# Patient Record
Sex: Female | Born: 1945 | Race: Black or African American | Hispanic: No | Smoking: Never smoker
Health system: Southern US, Community
[De-identification: ages and names within clinical notes are randomized; demographics above are authoritative.]

## PROBLEM LIST (undated history)

## (undated) DIAGNOSIS — I10 Essential (primary) hypertension: Secondary | ICD-10-CM

## (undated) DIAGNOSIS — M1990 Unspecified osteoarthritis, unspecified site: Secondary | ICD-10-CM

## (undated) DIAGNOSIS — Z9989 Dependence on other enabling machines and devices: Secondary | ICD-10-CM

## (undated) DIAGNOSIS — L309 Dermatitis, unspecified: Secondary | ICD-10-CM

## (undated) DIAGNOSIS — J302 Other seasonal allergic rhinitis: Secondary | ICD-10-CM

## (undated) HISTORY — PX: COLONOSCOPY: SHX174

## (undated) HISTORY — PX: DILATION AND CURETTAGE OF UTERUS: SHX78

## (undated) HISTORY — PX: OTHER SURGICAL HISTORY: SHX169

## (undated) HISTORY — PX: CHOLECYSTECTOMY: SHX55

## (undated) HISTORY — PX: MULTIPLE TOOTH EXTRACTIONS: SHX2053

---

## 1998-05-30 ENCOUNTER — Emergency Department (HOSPITAL_COMMUNITY): Admission: EM | Admit: 1998-05-30 | Discharge: 1998-05-30 | Payer: Self-pay | Admitting: Emergency Medicine

## 1999-05-20 ENCOUNTER — Emergency Department (HOSPITAL_COMMUNITY): Admission: EM | Admit: 1999-05-20 | Discharge: 1999-05-20 | Payer: Self-pay | Admitting: Emergency Medicine

## 1999-05-20 ENCOUNTER — Encounter: Payer: Self-pay | Admitting: Emergency Medicine

## 2000-03-24 ENCOUNTER — Encounter: Payer: Self-pay | Admitting: Family Medicine

## 2000-03-24 ENCOUNTER — Encounter: Admission: RE | Admit: 2000-03-24 | Discharge: 2000-03-24 | Payer: Self-pay | Admitting: Family Medicine

## 2000-06-28 HISTORY — PX: KNEE ARTHROSCOPY: SHX127

## 2000-07-06 ENCOUNTER — Ambulatory Visit (HOSPITAL_BASED_OUTPATIENT_CLINIC_OR_DEPARTMENT_OTHER): Admission: RE | Admit: 2000-07-06 | Discharge: 2000-07-06 | Payer: Self-pay | Admitting: Orthopedic Surgery

## 2001-11-21 ENCOUNTER — Emergency Department (HOSPITAL_COMMUNITY): Admission: EM | Admit: 2001-11-21 | Discharge: 2001-11-21 | Payer: Self-pay | Admitting: Emergency Medicine

## 2004-12-20 ENCOUNTER — Other Ambulatory Visit: Admission: RE | Admit: 2004-12-20 | Discharge: 2004-12-20 | Payer: Self-pay | Admitting: Obstetrics and Gynecology

## 2005-02-07 ENCOUNTER — Ambulatory Visit (HOSPITAL_COMMUNITY): Admission: RE | Admit: 2005-02-07 | Discharge: 2005-02-07 | Payer: Self-pay | Admitting: Obstetrics and Gynecology

## 2005-02-07 ENCOUNTER — Encounter (INDEPENDENT_AMBULATORY_CARE_PROVIDER_SITE_OTHER): Payer: Self-pay | Admitting: *Deleted

## 2006-01-19 ENCOUNTER — Emergency Department (HOSPITAL_COMMUNITY): Admission: EM | Admit: 2006-01-19 | Discharge: 2006-01-19 | Payer: Self-pay | Admitting: Emergency Medicine

## 2007-07-14 ENCOUNTER — Emergency Department (HOSPITAL_COMMUNITY): Admission: EM | Admit: 2007-07-14 | Discharge: 2007-07-14 | Payer: Self-pay | Admitting: Emergency Medicine

## 2007-07-19 ENCOUNTER — Ambulatory Visit: Payer: Self-pay | Admitting: Cardiovascular Disease

## 2007-07-22 ENCOUNTER — Ambulatory Visit: Payer: Self-pay

## 2011-05-13 NOTE — Assessment & Plan Note (Signed)
[facility]                            CARDIOLOGY OFFICE NOTE   Regina, Oneill                     MRN:          [id number]  DATE:07/19/2007                            DOB:          18-Nov-1946    Regina Oneill is a 65 year old patient referred by [facility], Dr.  Perrin Maltese and Burgess Amor, PA.  She was seen there on Wednesday for chest  pain.  They are seeking our opinion in regards to workup for further  chest pain to rule out coronary artery disease.   The patient has no documented coronary artery disease.  In general, she  does not get chest pain.  She was at work as a Comptroller sitting down  when she had substernal chest pain.  It is atypical for coronary artery  disease.  It was nonexertional.  It was somewhat sharp.  It was in the  center of her chest and radiated to the right shoulder.  She had some  tingling in her right arm.  There was no associated shortness of breath  or diaphoresis.  The episode lasted in the order of 10-20 minutes.  It  was actually gone by the time she went to urgent care.   She did nothing to relieve it and there was nothing that exacerbated it.  In particular, there was no pleuritic pain to it.  She had no recent  trauma.  She has not had previous cardiac workup.   As far she is knows, she is not a diabetic, not hypercholesterolemic,  not hypertensive, does not smoke, and there is no family history.   Her pain has not recurred since Wednesday.   Review of systems is otherwise negative.   Past medical history is only remarkable for gallbladder surgery in 1984  and right knee surgery in 2000.   She has no known allergies.   Social:  She is widowed since 2004.  She had three children.  Unfortunately, one was killed in prison, and her two other children are  52 and 39.  They live in a group home and are autistic.  She works as a  Comptroller, previously at [employer] and now at [employer].  She is fairly  sedentary  and does not have a lot of hobbies.   She does not drink or smoke.   Her medication list does not include anything on a regular basis.  She  takes p.r.n. Tylenol or aspirin for arthritic-like pain.   The patient's family history is noncontributory.  In particular, there is no premature coronary artery disease.  Hypertension and diabetes runs on both sides of the family but she does  not have either.   EXAMINATION:  Remarkable for a healthy-appearing middle-aged black  female in no distress.  Affect is appropriate but quiet.  HEENT is  normal.  Blood pressure is 130/70, pulse 63 and regular, respiratory  rate is 14, she is afebrile.  NECK:  Supple.  There is no thyromegaly, no lymphadenopathy, no carotid  bruits, no tenderness, no JVP elevation.  LUNGS:  Clear, good diaphragmatic motion, no wheezing.  There is  an S1, S2 with normal heart sounds.  PMI is normal.  ABDOMEN:  Benign, bowel sounds are positive.  There is no tenderness, no  AAA, no hepatosplenomegaly or hepatojugular reflux.  Femorals are +3  bilaterally with no bruit.  There is no lymphadenopathy.  Distal pulses are intact with no edema.  She is status post right knee  surgery.  NEUROLOGIC:  Nonfocal.  There is no muscular weakness.   Baseline EKG is normal with somewhat flat ST segments in the  inferolateral leads.   I reviewed her medical records from [facility].  She ruled out  by enzymes.  Her chest x-ray was read as no active disease.   Remainder of her lab work showed a sodium of 137, potassium 3.9, BUN 12,  glucose 190, hematocrit was 41.   Troponin and CPKs were negative.   IMPRESSION:  1. Somewhat atypical chest pain in a 65 year old patient.  Followup      stress Myoview.  No further workup if negative.  2. Health maintenance.  The patient needs fasting lipid and liver      profile.  She will follow up with Urgent Care for this.  She used      to see Dr. Susann Givens and I encouraged her to follow up  with him as      opposed to seeing different people continuously at urgent care      centers.  3. Minor arthritic pain, particularly in the right knee where she has      had prior surgery.  Continue aspirin and Tylenol p.r.n.   Overall I suspect that the patient's chest pain was noncardiac and she  will only be seen on a p.r.n. basis if her stress test is normal.     Theron Arista C. Eden Emms, MD, [facility]  Electronically Signed    PCN/MedQ  DD: 07/19/2007  DT: 07/19/2007  Job #: [id number]   cc:   Jonita Albee, M.D.  Jola Baptist. Idol, P.A.

## 2011-05-16 NOTE — Op Note (Signed)
NAMESAKARA, Regina Oneill              ACCOUNT NO.:  [id number]   MEDICAL RECORD NO.:  [id number]          PATIENT TYPE:  AMB   LOCATION:  SDC                           FACILITY:  [facility]   PHYSICIAN:  Michelle L. Grewal, M.D.DATE OF BIRTH:  1946-05-29   DATE OF PROCEDURE:  02/07/2005  DATE OF DISCHARGE:                                 OPERATIVE REPORT   PREOPERATIVE DIAGNOSIS:  Postmenopausal bleeding and thickened endometrium.   POSTOPERATIVE DIAGNOSIS:  Postmenopausal bleeding and thickened endometrium.   PROCEDURE:  Dilatation and curettage.   SURGEON:  Michelle L. Vincente Poli, M.D.   ASSISTANT:  Not applicable.   ANESTHESIA:  MAC with local.   SPECIMENS:  Uterine curettings.   ESTIMATED BLOOD LOSS:  Minimal.   COMPLICATIONS:  None.   PROCEDURE:  The patient was taken to the operating room, where she was given  sedation and placed in the lithotomy position.  She was prepped and draped  in the usual sterile fashion.  A speculum was inserted into the vagina.  The  cervix was grasped with a tenaculum and a paracervical block was performed  in the standard fashion at 5 and 7 o'clock.  The uterus was sounded and the  cervical internal os was gently dilated using Pratt dilators.  A sharp  curette was inserted into the uterus and the uterus was thoroughly curetted  of all tissue, and all tissue was sent to pathology for analysis.  There was  no bleeding noted at the end of the procedure.  All instruments were removed  from the vagina.  All sponge, lap and instrument counts were correct x2.  The patient tolerated the procedure well and went to the recovery room in  stable condition.      MLG/MEDQ  D:  02/07/2005  T:  02/07/2005  Job:  [id number]

## 2011-10-13 LAB — POCT CARDIAC MARKERS
CKMB, poc: 1
CKMB, poc: 1.1
Myoglobin, poc: 79.3
Operator id: 288831
Troponin i, poc: <0.05
Troponin i, poc: <0.05

## 2011-10-13 LAB — I-STAT 8, (EC8 V) (CONVERTED LAB)
Bicarbonate: 28.5 — ABNORMAL HIGH
Glucose, Bld: 90
Sodium: 137
TCO2: 30
pH, Ven: 7.425 — ABNORMAL HIGH

## 2011-10-13 LAB — POCT I-STAT CREATININE
Creatinine, Ser: 0.8
Operator id: 288831

## 2011-12-25 ENCOUNTER — Ambulatory Visit (INDEPENDENT_AMBULATORY_CARE_PROVIDER_SITE_OTHER): Payer: Medicare Other

## 2011-12-25 DIAGNOSIS — IMO0001 Reserved for inherently not codable concepts without codable children: Secondary | ICD-10-CM

## 2011-12-25 DIAGNOSIS — I10 Essential (primary) hypertension: Secondary | ICD-10-CM

## 2012-01-02 ENCOUNTER — Ambulatory Visit (INDEPENDENT_AMBULATORY_CARE_PROVIDER_SITE_OTHER): Payer: Medicare Other

## 2012-01-02 DIAGNOSIS — E119 Type 2 diabetes mellitus without complications: Secondary | ICD-10-CM

## 2012-01-30 ENCOUNTER — Encounter (HOSPITAL_COMMUNITY): Payer: Self-pay | Admitting: Pharmacist

## 2012-01-30 ENCOUNTER — Other Ambulatory Visit: Payer: Self-pay | Admitting: Obstetrics & Gynecology

## 2012-02-04 ENCOUNTER — Encounter (HOSPITAL_COMMUNITY): Payer: Self-pay

## 2012-02-04 ENCOUNTER — Encounter (HOSPITAL_COMMUNITY)
Admission: RE | Admit: 2012-02-04 | Discharge: 2012-02-04 | Disposition: A | Discharge status: Home / Self Care | Payer: Medicare Other | Source: Ambulatory Visit | Attending: Obstetrics & Gynecology | Admitting: Obstetrics & Gynecology

## 2012-02-04 ENCOUNTER — Other Ambulatory Visit: Payer: Self-pay

## 2012-02-04 HISTORY — DX: Essential (primary) hypertension: I10

## 2012-02-04 HISTORY — DX: Unspecified osteoarthritis, unspecified site: M19.90

## 2012-02-04 LAB — CBC
HCT: 38.5 % (ref 36.0–46.0)
Hemoglobin: 12.1 g/dL (ref 12.0–15.0)
RDW: 16.3 % — ABNORMAL HIGH (ref 11.5–15.5)
WBC: 9.9 10*3/uL (ref 4.0–10.5)

## 2012-02-04 LAB — BASIC METABOLIC PANEL
Chloride: 97 mEq/L (ref 96–112)
GFR calc Af Amer: 82 mL/min — ABNORMAL LOW (ref >90)
Potassium: 4.3 mEq/L (ref 3.5–5.1)
Sodium: 132 mEq/L — ABNORMAL LOW (ref 135–145)

## 2012-02-04 NOTE — Pre-Procedure Instructions (Signed)
Informed Dr Brayton Caves of patient's glucose of 201.  No orders given.  Ok to see patient DOS.

## 2012-02-04 NOTE — Patient Instructions (Signed)
   Your procedure is scheduled on: Monday, Feb 11th   Enter through the Main Entrance of [facility] at: 730am Pick up the phone at the desk and dial [phone] and inform us of your arrival.  Please call this number if you have any problems the morning of surgery: [phone]  Remember: Do not eat food after midnight: Sunday Do not drink clear liquids after: Sunday Take these medicines the morning of surgery with a SIP OF WATER:  Aspirin 81 mg, lisinopril, megace  Do not wear jewelry, make-up, or FINGER nail polish Do not wear lotions, powders, perfumes or deodorant. Do not shave 48 hours prior to surgery. Do not bring valuables to the hospital.  Patients discharged on the day of surgery will not be allowed to drive home.  Home with Sister Gwynneth Macleod cell [phone]   Remember to use your hibiclens as instructed.Please shower with 1/2 bottle the evening before your surgery and the other 1/2 bottle the morning of surgery.

## 2012-02-09 ENCOUNTER — Encounter (HOSPITAL_COMMUNITY): Payer: Self-pay | Admitting: Anesthesiology

## 2012-02-09 ENCOUNTER — Ambulatory Visit (HOSPITAL_COMMUNITY)
Admission: RE | Admit: 2012-02-09 | Discharge: 2012-02-09 | Disposition: A | Discharge status: Home / Self Care | Payer: Medicare Other | Source: Ambulatory Visit | Attending: Obstetrics & Gynecology | Admitting: Obstetrics & Gynecology

## 2012-02-09 ENCOUNTER — Other Ambulatory Visit: Payer: Self-pay | Admitting: Obstetrics & Gynecology

## 2012-02-09 ENCOUNTER — Ambulatory Visit (HOSPITAL_COMMUNITY): Payer: Medicare Other | Admitting: Anesthesiology

## 2012-02-09 ENCOUNTER — Encounter (HOSPITAL_COMMUNITY)
Admission: RE | Disposition: A | Discharge status: Home / Self Care | Payer: Self-pay | Source: Ambulatory Visit | Attending: Obstetrics & Gynecology

## 2012-02-09 ENCOUNTER — Encounter (HOSPITAL_COMMUNITY): Payer: Self-pay | Admitting: *Deleted

## 2012-02-09 DIAGNOSIS — N8501 Benign endometrial hyperplasia: Secondary | ICD-10-CM | POA: Insufficient documentation

## 2012-02-09 DIAGNOSIS — N950 Postmenopausal bleeding: Secondary | ICD-10-CM | POA: Diagnosis present

## 2012-02-09 DIAGNOSIS — Z01818 Encounter for other preprocedural examination: Secondary | ICD-10-CM | POA: Insufficient documentation

## 2012-02-09 DIAGNOSIS — N840 Polyp of corpus uteri: Secondary | ICD-10-CM | POA: Insufficient documentation

## 2012-02-09 DIAGNOSIS — Z01812 Encounter for preprocedural laboratory examination: Secondary | ICD-10-CM | POA: Insufficient documentation

## 2012-02-09 HISTORY — PX: HYSTEROSCOPY W/D&C: SHX1775

## 2012-02-09 LAB — GLUCOSE, CAPILLARY
Glucose-Capillary: 154 mg/dL — ABNORMAL HIGH (ref 70–99)
Glucose-Capillary: 131 mg/dL — ABNORMAL HIGH (ref 70–99)

## 2012-02-09 SURGERY — DILATATION AND CURETTAGE /HYSTEROSCOPY
Anesthesia: General | Site: Uterus | Wound class: Clean Contaminated

## 2012-02-09 MED ORDER — KETOROLAC TROMETHAMINE 30 MG/ML IJ SOLN
INTRAMUSCULAR | Status: AC
  Filled 2012-02-09: qty 1

## 2012-02-09 MED ORDER — PROPOFOL 10 MG/ML IV EMUL
INTRAVENOUS | Status: DC | PRN
  Administered 2012-02-09: 150 mg via INTRAVENOUS

## 2012-02-09 MED ORDER — IBUPROFEN 200 MG PO TABS: 400.0000 mg | ORAL_TABLET | Freq: Four times a day (QID) | ORAL | Status: AC | PRN

## 2012-02-09 MED ORDER — KETOROLAC TROMETHAMINE 30 MG/ML IJ SOLN: 15.0000 mg | Freq: Once | INTRAMUSCULAR | Status: DC | PRN

## 2012-02-09 MED ORDER — CEFAZOLIN SODIUM 1-5 GM-% IV SOLN
INTRAVENOUS | Status: AC
  Filled 2012-02-09: qty 50

## 2012-02-09 MED ORDER — PROPOFOL 10 MG/ML IV EMUL
INTRAVENOUS | Status: AC
  Filled 2012-02-09: qty 20

## 2012-02-09 MED ORDER — PROMETHAZINE HCL 25 MG/ML IJ SOLN: 6.2500 mg | INTRAMUSCULAR | Status: DC | PRN

## 2012-02-09 MED ORDER — FENTANYL CITRATE 0.05 MG/ML IJ SOLN: 25.0000 ug | INTRAMUSCULAR | Status: DC | PRN

## 2012-02-09 MED ORDER — KETOROLAC TROMETHAMINE 30 MG/ML IJ SOLN
INTRAMUSCULAR | Status: DC | PRN
  Administered 2012-02-09: 30 mg via INTRAVENOUS

## 2012-02-09 MED ORDER — LIDOCAINE HCL (CARDIAC) 20 MG/ML IV SOLN
INTRAVENOUS | Status: AC
  Filled 2012-02-09: qty 5

## 2012-02-09 MED ORDER — MEPERIDINE HCL 25 MG/ML IJ SOLN
6.2500 mg | INTRAMUSCULAR | Status: DC | PRN
Start: 2012-02-09 — End: 2012-02-09

## 2012-02-09 MED ORDER — ONDANSETRON HCL 4 MG/2ML IJ SOLN
INTRAMUSCULAR | Status: DC | PRN
  Administered 2012-02-09: 4 mg via INTRAVENOUS

## 2012-02-09 MED ORDER — MIDAZOLAM HCL 5 MG/5ML IJ SOLN
INTRAMUSCULAR | Status: DC | PRN
  Administered 2012-02-09: 2 mg via INTRAVENOUS

## 2012-02-09 MED ORDER — LACTATED RINGERS IV SOLN
INTRAVENOUS | Status: DC
  Administered 2012-02-09 (×2): via INTRAVENOUS

## 2012-02-09 MED ORDER — SODIUM CHLORIDE 0.9 % IR SOLN
Status: DC | PRN
  Administered 2012-02-09: 1

## 2012-02-09 MED ORDER — ONDANSETRON HCL 4 MG/2ML IJ SOLN
INTRAMUSCULAR | Status: AC
  Filled 2012-02-09: qty 2

## 2012-02-09 MED ORDER — CEFAZOLIN SODIUM 1-5 GM-% IV SOLN
1.0000 g | INTRAVENOUS | Status: AC
  Administered 2012-02-09: 1 g via INTRAVENOUS

## 2012-02-09 MED ORDER — LIDOCAINE HCL 1 % IJ SOLN
INTRAMUSCULAR | Status: DC | PRN
  Administered 2012-02-09: 20 mL

## 2012-02-09 MED ORDER — FENTANYL CITRATE 0.05 MG/ML IJ SOLN
INTRAMUSCULAR | Status: AC
  Filled 2012-02-09: qty 2

## 2012-02-09 MED ORDER — LIDOCAINE HCL (CARDIAC) 20 MG/ML IV SOLN
INTRAVENOUS | Status: DC | PRN
  Administered 2012-02-09: 30 mg via INTRAVENOUS

## 2012-02-09 MED ORDER — MIDAZOLAM HCL 2 MG/2ML IJ SOLN
INTRAMUSCULAR | Status: AC
  Filled 2012-02-09: qty 2

## 2012-02-09 MED ORDER — FENTANYL CITRATE 0.05 MG/ML IJ SOLN
INTRAMUSCULAR | Status: DC | PRN
  Administered 2012-02-09 (×2): 50 ug via INTRAVENOUS

## 2012-02-09 SURGICAL SUPPLY — 17 items
CANISTER SUCTION 2500CC (MISCELLANEOUS) ×2 IMPLANT
CATH ROBINSON RED A/P 16FR (CATHETERS) ×2 IMPLANT
CLOTH BEACON ORANGE TIMEOUT ST (SAFETY) ×2 IMPLANT
CONTAINER PREFILL 10% NBF 60ML (FORM) ×4 IMPLANT
ELECT REM PT RETURN 9FT ADLT (ELECTROSURGICAL) ×2
ELECTRODE REM PT RTRN 9FT ADLT (ELECTROSURGICAL) ×1 IMPLANT
ELECTRODE ROLLER VERSAPOINT (ELECTRODE) IMPLANT
ELECTRODE RT ANGLE VERSAPOINT (CUTTING LOOP) IMPLANT
GLOVE BIO SURGEON STRL SZ7 (GLOVE) ×2 IMPLANT
GLOVE BIOGEL PI IND STRL 7.0 (GLOVE) ×2 IMPLANT
GLOVE BIOGEL PI INDICATOR 7.0 (GLOVE) ×2
GOWN PREVENTION PLUS LG XLONG (DISPOSABLE) ×4 IMPLANT
GOWN STRL REIN XL XLG (GOWN DISPOSABLE) ×2 IMPLANT
LOOP ANGLED CUTTING 22FR (CUTTING LOOP) IMPLANT
PACK VAGINAL MINOR WOMEN LF (CUSTOM PROCEDURE TRAY) ×1 IMPLANT
TOWEL OR 17X24 6PK STRL BLUE (TOWEL DISPOSABLE) ×4 IMPLANT
WATER STERILE IRR 1000ML POUR (IV SOLUTION) ×2 IMPLANT

## 2012-02-09 NOTE — Anesthesia Preprocedure Evaluation (Signed)
Anesthesia Evaluation  Patient identified by MRN, date of birth, ID band Patient awake    Reviewed: Allergy & Precautions, H&P , NPO status , Patient's Chart, lab work & pertinent test results  Airway Mallampati: I TM Distance: >3 FB Neck ROM: full    Dental No notable dental hx.    Pulmonary neg pulmonary ROS,  clear to auscultation  Pulmonary exam normal       Cardiovascular hypertension, Pt. on medications     Neuro/Psych Negative Neurological ROS  Negative Psych ROS   GI/Hepatic negative GI ROS, Neg liver ROS,   Endo/Other  Diabetes mellitus-, Well Controlled, Type obesity  Renal/GU negative Renal ROS  Genitourinary negative   Musculoskeletal negative musculoskeletal ROS (+)   Abdominal (+) obese,   Peds negative pediatric ROS (+)  Hematology negative hematology ROS (+)   Anesthesia Other Findings   Reproductive/Obstetrics (+) Pregnancy                           Anesthesia Physical Anesthesia Plan  ASA: III  Anesthesia Plan: General   Post-op Pain Management:    Induction: Intravenous  Airway Management Planned: LMA  Additional Equipment:   Intra-op Plan:   Post-operative Plan:   Informed Consent: I have reviewed the patients History and Physical, chart, labs and discussed the procedure including the risks, benefits and alternatives for the proposed anesthesia with the patient or authorized representative who has indicated his/her understanding and acceptance.     Plan Discussed with: CRNA  Anesthesia Plan Comments:         Anesthesia Quick Evaluation

## 2012-02-09 NOTE — H&P (Signed)
Regina Oneill is an 66 y.o. female here for [facility] and Hysteroscopy and polypectomy for endometrial polyp as well as simple endometrial hyperplasia on Megace for few months. Office EBX done for postmenopausal bleeding with thick irreg endometrial echo on sono, then started Megace. But had another episode of vaginal bleeding while on Megace. Sonohystogram revealed polyp.   Pertinent Gynecological History: Menses: menopausal, no HRT, noted postmenopausal bleeding.  DES exposure: none Previous GYN Procedures: D&C  Last mammogram: Normal Last pap: Normal ObHx: G3P3 (SVDs)  Past Medical History  Diagnosis Date  . Arthritis     knee - no meds  . Diabetes mellitus     recently dx in 12/12  . Hypertension     recently dx in 12/12   Past Surgical History  Procedure Date  . Knee arthroscopy 06/2000    right  . Multiple tooth extractions     all teeth extracted  . Svd     x 3  . Cholecystectomy   . Colonoscopy   . Dilation and curettage of uterus    No family history on file.  No GU/GI/ breast cancer  Social History:  reports that she has never smoked. She has never used smokeless tobacco. She reports that she does not drink alcohol or use illicit drugs.  Allergies: No Known Allergies  Review of Systems  Constitutional: Negative for fever and chills.  Respiratory: Negative for shortness of breath.   Cardiovascular: Negative for chest pain.  Gastrointestinal: Negative for heartburn and abdominal pain.  Neurological: Negative for dizziness and headaches.    Physical Exam There were no vitals taken for this visit. A&O x 3, no acute distress. Pleasant HEENT neg Lungs CTA bilat CV RRR, S1S2 normal Abdo soft, non tender, non acute Extr no edema/ tenderness Pelvic Uterus anteverted, slightly enlarged, mobile. No adnexal masses  No results found. Office sono  And EBX reviewed.   Assessment/Plan: D&C, hysteroscopic polypectomy. Risks/complications reviewed incl infection,  bleeding, damage to internal organs and other risks incl VTE, pneumonia etc, understands and agrees.   Lineth Thielke R 02/09/2012, 7:10 AM

## 2012-02-09 NOTE — Op Note (Signed)
Preoperative diagnosis: Postmenopausal bleeding, simple endometrial hyperplasia, endometrial polyp  Postop diagnosis: as above.  Procedure: Hysteroscopic polypectomy (with True clear), D&C Anesthesia General and paracervical block  Surgeon: Shea Evans, MD  Assistant: none IV fluids : LR 1100 cc Estimated blood loss : minimal  Urine output: straight catheter preop   Complications none  Condition stable  Disposition PACU  Specimen: Endometrial polyp with endometrial curettings   Procedure  Indication: Postmenopausal patient with office endometrial biopsy noting simple endometrial hyperplasia without atypia. She is on Megace but has another episode of bleeding and endometrial stripe was still thick on sonography with endometrial polyp noted on saline sonohystogram. Patient was counseled on risks/ complications including infection, bleeding, damage to internal organs, she understood and agrees, gave informed written consent.  Patient was brought to the operating room with IV running. Time out was carried out. She received preop 1 gm Ancef. She underwent general anesthesia without complications. She was given dorsolithotomy position. Parts were prepped and draped in standard fashion. Bladder was catheterized once. Bimanual exam revealed uterus to be anteverted and slightly enlarged. Speculum was placed and cervix was grasped with single-tooth tenaculum. Cervical block with 20 cc 1% plain Xylocaine given. The uterus was sounded to 10 cm. Cervical os was dilated to 21 [place] dilator. Smith-Nephew True Clear Hysteroscope was introduced in the uterine cavity under vision, using normal saline irrigation.  Findings: Large endometrial cavity with thick endometrial tissue with increased vascularity and polypoidal appearance and some yellow/pale areas that are concerning. Targeted polypectomy done using polyp blade with curetting of entire endometrial cavity under vision. Hysteroscope was removed and  endometrial curettage was performed with sharp curette. All tissue sent to pathology. Irrigation fluid deficit 350 cc.  All counts are correct x 2. No complications. Patient was made supine dorsal anesthesia and brought to the recovery room in stable condition.   Patient will be discharged home today. Follow up in 1 weeks in office. Warning signs of infection and excessive bleeding reviewed.   V.Gianny Killman, MD.

## 2012-02-09 NOTE — Anesthesia Postprocedure Evaluation (Signed)
Anesthesia Post Note  Patient: Regina Oneill  Procedure(s) Performed:  DILATATION AND CURETTAGE /HYSTEROSCOPY - true clear  Anesthesia type: General  Patient location: PACU  Post pain: Pain level controlled  Post assessment: Post-op Vital signs reviewed  Last Vitals:  Filed Vitals:   02/09/12 1030  BP: 141/64  Pulse: 62  Temp:   Resp: 16    Post vital signs: Reviewed  Level of consciousness: sedated  Complications: No apparent anesthesia complicationsfj

## 2012-02-09 NOTE — Transfer of Care (Signed)
Immediate Anesthesia Transfer of Care Note  Patient: Regina Oneill  Procedure(s) Performed:  DILATATION AND CURETTAGE /HYSTEROSCOPY - true clear  Patient Location: PACU  Anesthesia Type: General  Level of Consciousness: awake  Airway & Oxygen Therapy: Patient connected to nasal cannula oxygen  Post-op Assessment: Report given to PACU RN and Post -op Vital signs reviewed and stable  Post vital signs: Reviewed and stable  Complications: No apparent anesthesia complications

## 2012-02-10 ENCOUNTER — Encounter (HOSPITAL_COMMUNITY): Payer: Self-pay | Admitting: Obstetrics & Gynecology

## 2012-03-01 ENCOUNTER — Ambulatory Visit (INDEPENDENT_AMBULATORY_CARE_PROVIDER_SITE_OTHER): Payer: Medicare Other | Admitting: Family Medicine

## 2012-03-01 DIAGNOSIS — J029 Acute pharyngitis, unspecified: Secondary | ICD-10-CM

## 2012-03-01 DIAGNOSIS — J00 Acute nasopharyngitis [common cold]: Secondary | ICD-10-CM

## 2012-03-01 DIAGNOSIS — R05 Cough: Secondary | ICD-10-CM

## 2012-03-01 DIAGNOSIS — R059 Cough, unspecified: Secondary | ICD-10-CM

## 2012-03-01 DIAGNOSIS — IMO0001 Reserved for inherently not codable concepts without codable children: Secondary | ICD-10-CM

## 2012-03-01 DIAGNOSIS — J312 Chronic pharyngitis: Secondary | ICD-10-CM

## 2012-03-01 LAB — POCT GLYCOSYLATED HEMOGLOBIN (HGB A1C): Hemoglobin A1C: 8.3

## 2012-03-01 MED ORDER — BENZONATATE 100 MG PO CAPS: 100.0000 mg | ORAL_CAPSULE | Freq: Two times a day (BID) | ORAL | Status: AC | PRN

## 2012-03-01 NOTE — Progress Notes (Signed)
[facility]:  Office Visit  Chief Complaint:  Chief Complaint  Patient presents with  . dm check up  . cough/cold/scratchy throat    HPI: Regina Oneill is a 66 y.o. female who complains of : 1. Recheck of diabetes-last HBA1c in Jan was 9.2. Checked glucose this AM 182. No hypoglycemia-lowest 116, denies neuropathy or vision problems. Last eye exam with optho was < 1 year ago. Was on Metformin and had stomach problems. She is currently on Januvia and is compliant, however cost is an issue.  2. Scratchy throat x 1 day, tried Alkaselzter without relief. Associated sxs: clear rhinorrhea. Denies fevers, chills, sinus or ear pain.   Past Medical History  Diagnosis Date  . Arthritis     knee - no meds  . Diabetes mellitus     recently dx in 12/12  . Hypertension     recently dx in 12/12   Past Surgical History  Procedure Date  . Knee arthroscopy 06/2000    right  . Multiple tooth extractions     all teeth extracted  . Svd     x 3  . Cholecystectomy   . Colonoscopy   . Dilation and curettage of uterus   . Hysteroscopy w/d&c 02/09/2012    Procedure: DILATATION AND CURETTAGE /HYSTEROSCOPY;  Surgeon: Robley Fries, MD;  Location: [facility];  Service: Gynecology;  Laterality: N/A;  true clear   History   Social History  . Marital Status: Widowed    Spouse Name: N/A    Number of Children: N/A  . Years of Education: N/A   Social History Main Topics  . Smoking status: Never Smoker   . Smokeless tobacco: Never Used  . Alcohol Use: No  . Drug Use: No  . Sexually Active: No   Other Topics Concern  . None   Social History Narrative  . None   No family history on file. No Known Allergies Prior to Admission medications   Medication Sig Start Date End Date Taking? Authorizing Provider  aspirin EC 81 MG tablet Take 81 mg by mouth daily.   Yes Historical Provider, MD  fluocinonide cream (LIDEX) 0.05 % Apply 1 application topically 2 (two) times daily as  needed. For eczema   Yes Historical Provider, MD  lisinopril (PRINIVIL,ZESTRIL) 5 MG tablet Take 5 mg by mouth daily.   Yes Historical Provider, MD  megestrol (MEGACE) 20 MG tablet Take 10 mg by mouth daily.    Yes Historical Provider, MD  sitaGLIPtin (JANUVIA) 100 MG tablet Take 100 mg by mouth daily after supper. Pt takes at 8pm   Yes Historical Provider, MD     ROS: The patient denies fevers, chills, night sweats, unintentional weight loss, chest pain, palpitations, wheezing, dyspnea on exertion, nausea, vomiting, abdominal pain, dysuria, hematuria, melena, numbness, weakness, or tingling. + cough, runny nose  All other systems have been reviewed and were otherwise negative with the exception of those mentioned in the HPI and as above.    PHYSICAL EXAM: Filed Vitals:   03/01/12 0908  BP: 136/78  Pulse: 76  Temp: 98.4 F (36.9 C)  Resp: 18   Filed Vitals:   03/01/12 0908  Height: 5' 3.5" (1.613 m)  Weight: 217 lb (98.431 kg)   Body mass index is 37.84 kg/(m^2).  General: Alert, no acute distress, obese HEENT:  Normocephalic, atraumatic, oropharynx patent. TM nl, no exudates, no sinus tenderness. + boggy red nares Cardiovascular:  Regular rate and rhythm, no  rubs murmurs or gallops.  No Carotid bruits, radial pulse intact. No pedal edema.  Respiratory: Clear to auscultation bilaterally.  No wheezes, rales, or rhonchi.  No cyanosis, no use of accessory musculature GI: No organomegaly, abdomen is soft and non-tender, positive bowel sounds.  No masses. Skin: No rashes. Neurologic: Facial musculature symmetric. Vibratory sense intact in Regina Oneill. Psychiatric: Patient is appropriate throughout our interaction. Lymphatic: No cervical lymphadenopathy Musculoskeletal: Gait intact.   LABS: Results for orders placed in visit on 03/01/12  POCT GLYCOSYLATED HEMOGLOBIN (HGB A1C)      Component Value Range   Hemoglobin A1C 8.3       EKG/XRAY:   Primary read interpreted by Dr. Conley Rolls at  [facility].   ASSESSMENT/PLAN: Encounter Diagnoses  Name Primary?  . Diabetes mellitus Yes  . Pharyngitis, chronic   . Cough    1. T2DM-not well controlled, pt has not been to diabetes center, will defer, HbA1c is 8.3 today , she wants to wait. Patient wants to try Metformin again to see if have stomach upset since Januvia too expensive. She still has some metformin at home. If she starts having GI issues then she will take the Januvia again. Will DC Januvia for now and if needed represcribe if have metformin sideeefects. Check HbA1c. F/u in 3 months in June.  2. Cough and pharyngitis and rhinitis-pt will defer flonase, wants just cough med, rx Tessalon    Regina Oneill PHUONG, DO 03/06/2012 10:08 AM

## 2012-03-03 ENCOUNTER — Telehealth: Payer: Self-pay

## 2012-03-03 MED ORDER — HYDROCODONE-HOMATROPINE 5-1.5 MG PO TABS: 1.0000 | ORAL_TABLET | ORAL | Status: DC | PRN

## 2012-03-03 NOTE — Telephone Encounter (Signed)
CALLED IN RX AND INFORMED PT.

## 2012-03-03 NOTE — Telephone Encounter (Signed)
Please call in Hycodan tabs as ordered.

## 2012-03-03 NOTE — Telephone Encounter (Signed)
PT WAS RX'D TESSALON PERLES ON Monday. SHE SAYS THESE AREN'T HELPING. CAN WE CALL IN SOMETHING STRONGER?

## 2012-03-03 NOTE — Telephone Encounter (Signed)
PATIENT SAW DR. Nedra Hai ON Monday AND WAS PRESCRIBED SOME COUGH MEDICINE.  SHE SAYS IT IS NOT HELPING AND WANTS TO KNOW IF WE CAN CALL HER IN SOMETHING STRONGER.  SHE USES [facility] ON [address].

## 2012-03-15 ENCOUNTER — Other Ambulatory Visit: Payer: Self-pay | Admitting: Physician Assistant

## 2012-03-16 ENCOUNTER — Telehealth: Payer: Self-pay

## 2012-03-16 NOTE — Telephone Encounter (Signed)
If the patient still needs a narcotic cough medication, she needs re-evaluation.  Please RTC.

## 2012-03-16 NOTE — Telephone Encounter (Signed)
Pt states she needs a refill on hydrocodone cough medicine. Please call pt at [phone]. Pt uses [facility] [address]

## 2012-03-17 NOTE — Telephone Encounter (Signed)
LMOM for pt explaining message from Chelle. Asked for CB if pt has any ?s

## 2012-03-25 ENCOUNTER — Ambulatory Visit (INDEPENDENT_AMBULATORY_CARE_PROVIDER_SITE_OTHER): Payer: Medicare Other | Admitting: Family Medicine

## 2012-03-25 VITALS — BP 132/74 | HR 74 | Temp 99.0°F | Resp 16 | Ht 63.5 in | Wt 216.0 lb

## 2012-03-25 DIAGNOSIS — J329 Chronic sinusitis, unspecified: Secondary | ICD-10-CM

## 2012-03-25 DIAGNOSIS — R05 Cough: Secondary | ICD-10-CM

## 2012-03-25 DIAGNOSIS — J45909 Unspecified asthma, uncomplicated: Secondary | ICD-10-CM

## 2012-03-25 DIAGNOSIS — J019 Acute sinusitis, unspecified: Secondary | ICD-10-CM

## 2012-03-25 DIAGNOSIS — E119 Type 2 diabetes mellitus without complications: Secondary | ICD-10-CM

## 2012-03-25 LAB — POCT CBC
Granulocyte percent: 71.7 %G (ref 37–80)
HCT, POC: 34.6 % — AB (ref 37.7–47.9)
MCH, POC: 26.6 pg — AB (ref 27–31.2)
MCV: 85.2 fL (ref 80–97)
MID (cbc): 0.7 (ref 0–0.9)
POC LYMPH PERCENT: 22.6 %L (ref 10–50)
Platelet Count, POC: 447 10*3/uL — AB (ref 142–424)
RBC: 4.06 M/uL (ref 4.04–5.48)
WBC: 12.9 10*3/uL — AB (ref 4.6–10.2)

## 2012-03-25 MED ORDER — PREDNISONE 20 MG PO TABS: 20.0000 mg | ORAL_TABLET | Freq: Every day | ORAL | Status: AC

## 2012-03-25 MED ORDER — HYDROCODONE-HOMATROPINE 5-1.5 MG/5ML PO SYRP: 5.0000 mL | ORAL_SOLUTION | Freq: Three times a day (TID) | ORAL | Status: AC | PRN

## 2012-03-25 MED ORDER — GLIMEPIRIDE 2 MG PO TABS: 2.0000 mg | ORAL_TABLET | Freq: Every day | ORAL | Status: DC

## 2012-03-25 MED ORDER — AMOXICILLIN 875 MG PO TABS: 875.0000 mg | ORAL_TABLET | Freq: Two times a day (BID) | ORAL | Status: AC

## 2012-03-25 NOTE — Patient Instructions (Addendum)
1) Sinusitis  2) Reactive airways(airway inflammation) 3) Diabetes 4) Cough  Plan/ Amoxil twice daily until antibiotics completed Prednisone 1 tablet daily; monitor blood sugar while on prednisone  Add Amaryl to help lower blood sugars Stop lisinopril as this may be contributing to cough. Follow up in 1 month

## 2012-03-25 NOTE — Progress Notes (Signed)
  Subjective:    Patient ID: Regina Oneill, female    DOB: Sep 18, 1946, 66 y.o.   MRN: [id number]  Cough This is a chronic problem. The current episode started more than 1 month ago. The cough is productive of purulent sputum. Pertinent negatives include no chills or fever.  Sore Throat  Associated symptoms include coughing.   Cough worse at night; no wheezing Has tried tessalon perles, hydromet, and delsym  On lisinopril  Review of Systems  Constitutional: Negative for fever and chills.  Respiratory: Positive for cough.       Blood sugars varying up to 212 Objective:   Physical Exam  HENT:  Head: Normocephalic.  Nose: Mucosal edema and rhinorrhea present.  Neck: Neck supple.  Cardiovascular: Normal rate, regular rhythm and normal heart sounds.   Pulmonary/Chest: Wheezes: coarse breath sounds.  Neurological: She is alert.  Skin: Skin is warm.      Results for orders placed in visit on 03/25/12  POCT CBC      Component Value Range   WBC 12.9 (*) 4.6 - 10.2 (K/uL)   Lymph, poc 2.9  0.6 - 3.4    POC LYMPH PERCENT 22.6  10 - 50 (%L)   MID (cbc) 0.7  0 - 0.9    POC MID % 5.73  0 - 12 (%M)   POC Granulocyte 9.2 (*) 2 - 6.9    Granulocyte percent 71.7  37 - 80 (%G)   RBC 4.06  4.04 - 5.48 (M/uL)   Hemoglobin 10.8 (*) 12.2 - 16.2 (g/dL)   HCT, POC 40.9 (*) 81.1 - 47.9 (%)   MCV 85.2  80 - 97 (fL)   MCH, POC 26.6 (*) 27 - 31.2 (pg)   MCHC 31.2 (*) 31.8 - 35.4 (g/dL)   RDW, POC 91.4     Platelet Count, POC 447 (*) 142 - 424 (K/uL)   MPV 8.0  0 - 99.8 (fL)  GLUCOSE, POCT (MANUAL RESULT ENTRY)      Component Value Range   POC Glucose 138         Assessment & Plan:   1. Cough  POCT CBC, HYDROcodone-homatropine (HYCODAN) 5-1.5 MG/5ML syrup  2. Sinusitis  amoxicillin (AMOXIL) 875 MG tablet  3. RAD (reactive airway disease)  predniSONE (DELTASONE) 20 MG tablet  4. DM (diabetes mellitus)  POCT glucose (manual entry), glimepiride (AMARYL) 2 MG tablet  Hold  lisinoopril See patient instructions Anticipatory guidance

## 2012-03-26 ENCOUNTER — Encounter: Payer: Self-pay | Admitting: Family Medicine

## 2012-03-26 DIAGNOSIS — E119 Type 2 diabetes mellitus without complications: Secondary | ICD-10-CM | POA: Insufficient documentation

## 2012-04-30 ENCOUNTER — Ambulatory Visit (INDEPENDENT_AMBULATORY_CARE_PROVIDER_SITE_OTHER): Payer: Medicare Other | Admitting: Family Medicine

## 2012-04-30 VITALS — BP 144/76 | HR 69 | Temp 97.6°F | Resp 16 | Ht 63.5 in | Wt 219.0 lb

## 2012-04-30 DIAGNOSIS — E119 Type 2 diabetes mellitus without complications: Secondary | ICD-10-CM

## 2012-04-30 MED ORDER — LISINOPRIL 5 MG PO TABS: 5.0000 mg | ORAL_TABLET | Freq: Every day | ORAL | Status: DC

## 2012-04-30 NOTE — Progress Notes (Signed)
  Subjective:    Patient ID: Regina Oneill, female    DOB: 08/19/1946, 66 y.o.   MRN: [id number]  HPI  Presents in follow up of DM  Taking diabetic education classes at [facility]; offered by the senior resource center.  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  More aware of proper food choices and monitoring carbohydrate intake  Denies LE edema  Review of Systems     Objective:   Physical Exam  Constitutional: She appears well-developed.  Neck: Neck supple. No thyromegaly present.  Cardiovascular: Normal rate, regular rhythm and normal heart sounds.   Pulmonary/Chest: Effort normal and breath sounds normal.  Abdominal: Soft. Bowel sounds are normal.  Neurological: She is alert.  Skin: Skin is warm.       No LE lesions DP 2+ (B)  Psychiatric: She has a normal mood and affect.        Assessment & Plan:   1. DM type 2 (diabetes mellitus, type 2)  lisinopril (PRINIVIL,ZESTRIL) 5 MG tablet    Resume ace since cough resolved after respiratory symtoms cleared Continue with educational classes and life style changes See AVS

## 2012-05-14 ENCOUNTER — Ambulatory Visit (INDEPENDENT_AMBULATORY_CARE_PROVIDER_SITE_OTHER): Payer: Medicare Other | Admitting: Family Medicine

## 2012-05-14 ENCOUNTER — Ambulatory Visit: Payer: Medicare Other

## 2012-05-14 VITALS — BP 137/71 | HR 66 | Temp 98.4°F | Resp 16 | Ht 63.5 in | Wt 220.6 lb

## 2012-05-14 DIAGNOSIS — R05 Cough: Secondary | ICD-10-CM

## 2012-05-14 DIAGNOSIS — E119 Type 2 diabetes mellitus without complications: Secondary | ICD-10-CM

## 2012-05-14 MED ORDER — LOSARTAN POTASSIUM 25 MG PO TABS: 25.0000 mg | ORAL_TABLET | Freq: Every day | ORAL | Status: DC

## 2012-05-14 NOTE — Progress Notes (Signed)
  Patient Name: Regina Oneill Date of Birth: [birth year] Medical Record Number: [id number] Gender: female Date of Encounter: 05/14/2012  History of Present Illness:  Regina Oneill is a 67 y.o. very pleasant female patient who presents with the following:  Here today to evaluate a chronic cough.  This has been going on for several months but "only started to annoy me a week ago."  The cough is dry.  No fever.  No runny nose or stuffy nose.  No GI symptoms.   She has been using OTC cough medications for this off and on She has never been a smoker Not coughing up any blood Of note she had stopped taking her lisinopril for a time but then restarted it about 2 weeks ago  Patient Active Problem List  Diagnoses  . Postmenopausal bleeding  . DM (diabetes mellitus)   Past Medical History  Diagnosis Date  . Arthritis     knee - no meds  . Diabetes mellitus     recently dx in 12/12  . Hypertension     recently dx in 12/12   Past Surgical History  Procedure Date  . Knee arthroscopy 06/2000    right  . Multiple tooth extractions     all teeth extracted  . Svd     x 3  . Cholecystectomy   . Colonoscopy   . Dilation and curettage of uterus   . Hysteroscopy w/d&c 02/09/2012    Procedure: DILATATION AND CURETTAGE /HYSTEROSCOPY;  Surgeon: Robley Fries, MD;  Location: [facility];  Service: Gynecology;  Laterality: N/A;  true clear   History  Substance Use Topics  . Smoking status: Never Smoker   . Smokeless tobacco: Never Used  . Alcohol Use: No   No family history on file. No Known Allergies  Medication list has been reviewed and updated.  Review of Systems: As per HPI- otherwise negative.   Physical Examination: Filed Vitals:   05/14/12 1822  BP: 137/71  Pulse: 66  Temp: 98.4 F (36.9 C)  TempSrc: Oral  Resp: 16  Height: 5' 3.5" (1.613 m)  Weight: 220 lb 9.6 oz (100.064 kg)    Body mass index is 38.46 kg/(m^2).  GEN: WDWN, NAD, Non-toxic, A & O x 3,  obese HEENT: Atraumatic, Normocephalic. Neck supple. No masses, No LAD.  Tm and oropharynx wnl, nasal cavity normal Ears and Nose: No external deformity. CV: RRR, No M/G/R. No JVD. No thrill. No extra heart sounds. PULM: CTA B, no wheezes, crackles, rhonchi. No retractions. No resp. distress. No accessory muscle use. EXTR: No c/c/e NEURO Normal gait.  PSYCH: Normally interactive. Conversant. Not depressed or anxious appearing.  Calm demeanor.   [facility] reading (PRIMARY) by  Dr. Patsy Lager.  Negative chest xray   Assessment and Plan: 1. Cough  DG Chest 2 View  2. Diabetes mellitus  losartan (COZAAR) 25 MG tablet   Suspect that her cough may be lisinopril related.  Will d/c this and can start cozaar if she notes a difference.  She is on a tiny dose just for renal protection so ok to go a few days without either medication as a trial.  She will let me know if she does not get better- otherwise may start her losartan instead of lisinopril

## 2012-05-26 ENCOUNTER — Telehealth: Payer: Self-pay | Admitting: Radiology

## 2012-05-26 ENCOUNTER — Ambulatory Visit: Payer: Medicare Other

## 2012-05-26 ENCOUNTER — Ambulatory Visit (INDEPENDENT_AMBULATORY_CARE_PROVIDER_SITE_OTHER): Payer: Medicare Other | Admitting: Family Medicine

## 2012-05-26 ENCOUNTER — Ambulatory Visit
Admission: RE | Admit: 2012-05-26 | Discharge: 2012-05-26 | Disposition: A | Discharge status: Home / Self Care | Payer: Medicare Other | Source: Ambulatory Visit | Attending: Family Medicine | Admitting: Family Medicine

## 2012-05-26 VITALS — BP 144/78 | HR 67 | Temp 98.1°F | Resp 18 | Ht 63.5 in | Wt 214.0 lb

## 2012-05-26 DIAGNOSIS — R1084 Generalized abdominal pain: Secondary | ICD-10-CM

## 2012-05-26 DIAGNOSIS — R3129 Other microscopic hematuria: Secondary | ICD-10-CM

## 2012-05-26 DIAGNOSIS — R109 Unspecified abdominal pain: Secondary | ICD-10-CM

## 2012-05-26 DIAGNOSIS — K5792 Diverticulitis of intestine, part unspecified, without perforation or abscess without bleeding: Secondary | ICD-10-CM

## 2012-05-26 LAB — POCT URINALYSIS DIPSTICK
Bilirubin, UA: NEGATIVE
Glucose, UA: NEGATIVE
Leukocytes, UA: NEGATIVE
Nitrite, UA: NEGATIVE
Urobilinogen, UA: 0.2

## 2012-05-26 LAB — POCT CBC
Lymph, poc: 2.4 (ref 0.6–3.4)
MCH, POC: 26.1 pg — AB (ref 27–31.2)
MCHC: 31 g/dL — AB (ref 31.8–35.4)
MID (cbc): 0.8 (ref 0–0.9)
MPV: 7.8 fL (ref 0–99.8)
POC Granulocyte: 15.2 — AB (ref 2–6.9)
POC MID %: 4.5 %M (ref 0–12)
Platelet Count, POC: 556 10*3/uL — AB (ref 142–424)
RDW, POC: 14.8 %
WBC: 18.4 10*3/uL — AB (ref 4.6–10.2)

## 2012-05-26 LAB — POCT UA - MICROSCOPIC ONLY
Casts, Ur, LPF, POC: NEGATIVE
Yeast, UA: NEGATIVE

## 2012-05-26 LAB — POCT URINE PREGNANCY: Preg Test, Ur: NEGATIVE

## 2012-05-26 MED ORDER — CIPROFLOXACIN HCL 750 MG PO TABS: 750.0000 mg | ORAL_TABLET | Freq: Two times a day (BID) | ORAL | Status: AC

## 2012-05-26 MED ORDER — METRONIDAZOLE 500 MG PO TABS: ORAL_TABLET | ORAL | Status: DC

## 2012-05-26 MED ORDER — IOHEXOL 300 MG/ML  SOLN
125.0000 mL | Freq: Once | INTRAMUSCULAR | Status: AC | PRN
  Administered 2012-05-26: 125 mL via INTRAVENOUS

## 2012-05-26 MED ORDER — IOHEXOL 300 MG/ML  SOLN
20.0000 mL | Freq: Once | INTRAMUSCULAR | Status: AC | PRN
  Administered 2012-05-26: 20 mL via ORAL

## 2012-05-26 NOTE — Telephone Encounter (Signed)
i got the report- thanks amy

## 2012-05-26 NOTE — Telephone Encounter (Signed)
I spoke to [facility] and they were calling to make sure you got report of CT scan done today, Dr Gery Pray indicated in body of report he called report to you but Tech was not sure you got it. Report is in computer, but looks like you are aware.

## 2012-05-26 NOTE — Progress Notes (Signed)
Patient Name: Regina Oneill Date of Birth: 03-02-1946 Medical Record Number: [id number] Gender: female Date of Encounter: 05/26/2012  History of Present Illness:  Regina Oneill is a 66 y.o. very pleasant female patient who presents with the following:  Here with LLQ pain since this morning- she noted it after she woke up.  She has never had this before.  She also has vomited twice this morning.  No diarrhea.  She did eat some oatmeal this morning.  No fever or dysuria.   She thinks she may have had an episode of diverticulitis in the past but is not certain.   She has recently had some vaginal bleeding, but his is being evaluated by OBG and she has undergone a biopsy   She has never had a kidney stone-just gall stones but she had a cholecystectomy.  Her only abdominal surgery was her chole.  She did have diverticulosis noted on a recent colonoscopy.  Patient Active Problem List  Diagnoses  . Postmenopausal bleeding  . DM (diabetes mellitus)   Past Medical History  Diagnosis Date  . Arthritis     knee - no meds  . Diabetes mellitus     recently dx in 12/12  . Hypertension     recently dx in 12/12   Past Surgical History  Procedure Date  . Knee arthroscopy 06/2000    right  . Multiple tooth extractions     all teeth extracted  . Svd     x 3  . Cholecystectomy   . Colonoscopy   . Dilation and curettage of uterus   . Hysteroscopy w/d&c 02/09/2012    Procedure: DILATATION AND CURETTAGE /HYSTEROSCOPY;  Surgeon: Robley Fries, MD;  Location: [facility];  Service: Gynecology;  Laterality: N/A;  true clear   History  Substance Use Topics  . Smoking status: Never Smoker   . Smokeless tobacco: Never Used  . Alcohol Use: No   No family history on file. No Known Allergies  Medication list has been reviewed and updated.  Review of Systems: As per HPI- otherwise negative.  Physical Examination: Filed Vitals:   05/26/12 1156  BP: 144/78  Pulse: 67  Temp: 98.1 F (36.7  C)  TempSrc: Oral  Resp: 18  Height: 5' 3.5" (1.613 m)  Weight: 214 lb (97.07 kg)    Body mass index is 37.31 kg/(m^2).  GEN: WDWN, NAD, Non-toxic, A & O x 3, obese HEENT: Atraumatic, Normocephalic. Neck supple. No masses, No LAD.  TM and oropharynx wnl Ears and Nose: No external deformity. CV: RRR, No M/G/R. No JVD. No thrill. No extra heart sounds. PULM: CTA B, no wheezes, crackles, rhonchi. No retractions. No resp. distress. No accessory muscle use. ABD: S, ND.  She has LLQ tenderness but no rebound GU: normal external and internal genitalia, no discharge or CMT.   EXTR: No c/c/e NEURO Normal gait.  PSYCH: Normally interactive. Conversant. Not depressed or anxious appearing.  Calm demeanor.   Results for orders placed in visit on 05/26/12  POCT CBC      Component Value Range   WBC 18.4 (*) 4.6 - 10.2 (K/uL)   Lymph, poc 2.4  0.6 - 3.4    POC LYMPH PERCENT 12.8  10 - 50 (%L)   MID (cbc) 0.8  0 - 0.9    POC MID % 4.5  0 - 12 (%M)   POC Granulocyte 15.2 (*) 2 - 6.9    Granulocyte percent 82.7 (*) 37 - 80 (%G)  RBC 4.86  4.04 - 5.48 (M/uL)   Hemoglobin 12.7  12.2 - 16.2 (g/dL)   HCT, POC 56.4  33.2 - 47.9 (%)   MCV 84.3  80 - 97 (fL)   MCH, POC 26.1 (*) 27 - 31.2 (pg)   MCHC 31.0 (*) 31.8 - 35.4 (g/dL)   RDW, POC 95.1     Platelet Count, POC 556 (*) 142 - 424 (K/uL)   MPV 7.8  0 - 99.8 (fL)  POCT UA - MICROSCOPIC ONLY      Component Value Range   WBC, Ur, HPF, POC 0-2     RBC, urine, microscopic 5-12     Bacteria, U Microscopic trace     Mucus, UA positive     Epithelial cells, urine per micros 3-6     Crystals, Ur, HPF, POC negative     Casts, Ur, LPF, POC negative     Yeast, UA negative    POCT URINALYSIS DIPSTICK      Component Value Range   Color, UA dark yellow     Clarity, UA cloudy     Glucose, UA negative     Bilirubin, UA negative     Ketones, UA negative     Spec Grav, UA >=1.030     Blood, UA moderate     pH, UA 5.5     Protein, UA negative      Urobilinogen, UA 0.2     Nitrite, UA negative     Leukocytes, UA Negative    POCT URINE PREGNANCY      Component Value Range   Preg Test, Ur Negative     [facility] reading (PRIMARY) by  Dr. Patsy Lager.  Negative abdominal series- clips from cholecystectomy   ABDOMEN - 2 VIEW  Comparison: Chest x-ray dated 05/14/2012  Findings: Heart and lungs appear normal. Bilateral cervical ribs.  No free air or free fluid in the abdomen. Evidence of prior cholecystectomy. Sclerosis of the sacroiliac joints and symphysis pubis, probably secondary to previous childbirth. No acute osseous abnormality.  IMPRESSION: Benign-appearing abdomen and chest.  Assessment and Plan: 1. Abdominal  pain, other specified site  POCT CBC, POCT UA - Microscopic Only, POCT urinalysis dipstick, DG Abd 2 Views, CT Abdomen Pelvis W Contrast, POCT urine pregnancy  2. Hematuria  Urine culture   CT ABDOMEN AND PELVIS WITH CONTRAST  Technique: Multidetector CT imaging of the abdomen and pelvis was performed following the standard protocol during bolus administration of intravenous contrast.  Contrast: 20mL OMNIPAQUE IOHEXOL 300 MG/ML SOLN, OMNIPAQUE IOHEXOL 300 MG/ML SOLN  Comparison: None  BUN and creatinine were obtained on site at [facility] at [address] Results: BUN 6 mg/dL, Creatinine 0.6 mg/dL.  Findings: The lung bases are clear. The liver is somewhat low in attenuation which may indicate fatty infiltration. No focal abnormality is seen. Surgical clips are present from prior cholecystectomy. The pancreas is normal in size and the pancreatic duct is not dilated. The adrenal glands and spleen are unremarkable. The stomach is moderately fluid distended with no abnormality noted. The kidneys enhance with no calculus or mass and a small cyst is noted in the periphery of the right mid upper kidney. The abdominal aorta is normal in caliber. No adenopathy is seen.  There is edema of the  mucosa of the rectosigmoid colon with some pericolonic strandiness, consistent with sigmoid colon diverticulitis. Multiple sigmoid colon diverticula are present. No abscess is seen and no evidence of perforation is noted. Diverticula  are scattered throughout the remainder of the colon as well. The terminal ileum and appendix are unremarkable. No fluid is seen within the pelvis. The uterus is slightly prominent which may see represent uterine fibroids. No adnexal lesion is seen. The urinary bladder is decompressed. Stress sclerosis is noted along the SI joints bilaterally. There is degenerative disc disease at the L5-S1 level.  IMPRESSION:  1. Uncomplicated sigmoid colon diverticulitis. No abscess. No evidence of perforation. 2. Multiple colonic diverticula primarily within the rectosigmoid colon. 3. Probable fatty infiltration of the liver. 4. Degenerative disc disease at L5-S1. 5. Slightly prominent uterus for age. Question fibroids.  Received report from CT abdomen/ pelvis- positive for diverticulitis, no perforation or abscess.  Will start cipro 750 BID and flagyl 500 QID.  Called patient and let her know, asked her to work on a bland, soft diet and plenty of fluids.  She will let me know if not feeling better in the next 1-2 days- Sooner if worse.

## 2012-05-28 ENCOUNTER — Encounter: Payer: Self-pay | Admitting: Family Medicine

## 2012-05-28 LAB — URINE CULTURE: Colony Count: 1000

## 2012-05-28 NOTE — Progress Notes (Signed)
Addended by: Abbe Amsterdam C on: 05/28/2012 02:29 PM   Modules accepted: Orders

## 2012-05-31 NOTE — Progress Notes (Signed)
Addended by: Abbe Amsterdam C on: 05/31/2012 08:32 PM   Modules accepted: Orders

## 2012-06-28 ENCOUNTER — Ambulatory Visit (INDEPENDENT_AMBULATORY_CARE_PROVIDER_SITE_OTHER): Payer: Medicare Other | Admitting: Family Medicine

## 2012-06-28 VITALS — BP 139/69 | HR 73 | Temp 98.9°F | Resp 17 | Ht 63.0 in | Wt 207.0 lb

## 2012-06-28 DIAGNOSIS — K5732 Diverticulitis of large intestine without perforation or abscess without bleeding: Secondary | ICD-10-CM

## 2012-06-28 DIAGNOSIS — IMO0001 Reserved for inherently not codable concepts without codable children: Secondary | ICD-10-CM

## 2012-06-28 DIAGNOSIS — K5792 Diverticulitis of intestine, part unspecified, without perforation or abscess without bleeding: Secondary | ICD-10-CM

## 2012-06-28 LAB — POCT UA - MICROSCOPIC ONLY
Bacteria, U Microscopic: NEGATIVE
Casts, Ur, LPF, POC: NEGATIVE
Crystals, Ur, HPF, POC: NEGATIVE
Mucus, UA: NEGATIVE
WBC, Ur, HPF, POC: NEGATIVE
Yeast, UA: NEGATIVE

## 2012-06-28 MED ORDER — METRONIDAZOLE 250 MG PO TABS: 250.0000 mg | ORAL_TABLET | Freq: Two times a day (BID) | ORAL | Status: AC

## 2012-06-28 MED ORDER — CIPROFLOXACIN HCL 250 MG PO TABS: 250.0000 mg | ORAL_TABLET | Freq: Two times a day (BID) | ORAL | Status: AC

## 2012-06-28 NOTE — Progress Notes (Signed)
66 yo woman with 24 hours left lower quadrant abdominal pain, worsening.  She had similar problem in March dx'd with CT as diverticulitis and treated with two abx. No N,V, fever or hematochezia   O:  NAD  Results for orders placed in visit on 05/26/12  POCT CBC      Component Value Range   WBC 18.4 (*) 4.6 - 10.2 K/uL   Lymph, poc 2.4  0.6 - 3.4   POC LYMPH PERCENT 12.8  10 - 50 %L   MID (cbc) 0.8  0 - 0.9   POC MID % 4.5  0 - 12 %M   POC Granulocyte 15.2 (*) 2 - 6.9   Granulocyte percent 82.7 (*) 37 - 80 %G   RBC 4.86  4.04 - 5.48 M/uL   Hemoglobin 12.7  12.2 - 16.2 g/dL   HCT, POC 69.6  29.5 - 47.9 %   MCV 84.3  80 - 97 fL   MCH, POC 26.1 (*) 27 - 31.2 pg   MCHC 31.0 (*) 31.8 - 35.4 g/dL   RDW, POC 28.4     Platelet Count, POC 556 (*) 142 - 424 K/uL   MPV 7.8  0 - 99.8 fL  POCT UA - MICROSCOPIC ONLY      Component Value Range   WBC, Ur, HPF, POC 0-2     RBC, urine, microscopic 5-12     Bacteria, U Microscopic trace     Mucus, UA positive     Epithelial cells, urine per micros 3-6     Crystals, Ur, HPF, POC negative     Casts, Ur, LPF, POC negative     Yeast, UA negative    POCT URINALYSIS DIPSTICK      Component Value Range   Color, UA dark yellow     Clarity, UA cloudy     Glucose, UA negative     Bilirubin, UA negative     Ketones, UA negative     Spec Grav, UA >=1.030     Blood, UA moderate     pH, UA 5.5     Protein, UA negative     Urobilinogen, UA 0.2     Nitrite, UA negative     Leukocytes, UA Negative    URINE CULTURE      Component Value Range   Colony Count 1,000 COLONIES/ML     Organism ID, Bacteria Insignificant Growth    POCT URINE PREGNANCY      Component Value Range   Preg Test, Ur Negative     Heart:  Reg, no murmur Chest:  Clear Abdomen: soft, tender with no guarding or rebound.   A:  Diverticulitis, mild  P:  cipro and metronidazole  cpe due July 30th at 1:00pm

## 2012-06-29 ENCOUNTER — Telehealth: Payer: Self-pay

## 2012-06-29 NOTE — Telephone Encounter (Signed)
PT ASKING IF SHE NEEDS TO GO OFF METFORMIN,BP MEDS WHILE TAKING MEDS FOR DIVERTICULITIS,LIKE SHE DID PREVIIOUSLY?   BEST PHONE [phone]

## 2012-06-30 NOTE — Telephone Encounter (Signed)
Pt.notified

## 2012-06-30 NOTE — Telephone Encounter (Signed)
No, she can continue her regular medications.  Last time, she was advised to hold the metformin because of the contrast for the CT scan.

## 2012-07-07 NOTE — Progress Notes (Signed)
CPE appt 7/30 at 1pm at 104 noted.

## 2012-07-27 ENCOUNTER — Ambulatory Visit: Payer: Medicare Other | Admitting: Family Medicine

## 2012-07-27 ENCOUNTER — Encounter: Payer: Self-pay | Admitting: Family Medicine

## 2012-07-27 VITALS — BP 116/60 | HR 55 | Temp 98.7°F | Resp 16 | Ht 62.5 in | Wt 207.8 lb

## 2012-07-27 DIAGNOSIS — Z0000 Encounter for general adult medical examination without abnormal findings: Secondary | ICD-10-CM

## 2012-07-27 DIAGNOSIS — L309 Dermatitis, unspecified: Secondary | ICD-10-CM

## 2012-07-27 DIAGNOSIS — E119 Type 2 diabetes mellitus without complications: Secondary | ICD-10-CM

## 2012-07-27 DIAGNOSIS — I10 Essential (primary) hypertension: Secondary | ICD-10-CM

## 2012-07-27 LAB — POCT URINALYSIS DIPSTICK
Bilirubin, UA: NEGATIVE
Glucose, UA: NEGATIVE
Leukocytes, UA: NEGATIVE
Nitrite, UA: NEGATIVE
Protein, UA: NEGATIVE
Spec Grav, UA: 1.025
Urobilinogen, UA: 0.2
pH, UA: 5.5

## 2012-07-27 LAB — CBC
HCT: 36.3 % (ref 36.0–46.0)
Hemoglobin: 12 g/dL (ref 12.0–15.0)
MCH: 26.6 pg (ref 26.0–34.0)
MCHC: 33.1 g/dL (ref 30.0–36.0)
MCV: 80.5 fL (ref 78.0–100.0)
Platelets: 538 10*3/uL — ABNORMAL HIGH (ref 150–400)
RBC: 4.51 MIL/uL (ref 3.87–5.11)
RDW: 15.2 % (ref 11.5–15.5)
WBC: 10.4 10*3/uL (ref 4.0–10.5)

## 2012-07-27 LAB — POCT SEDIMENTATION RATE: POCT SED RATE: 66 mm/hr — AB (ref 0–22)

## 2012-07-27 LAB — POCT UA - MICROSCOPIC ONLY
Bacteria, U Microscopic: NEGATIVE
Casts, Ur, LPF, POC: NEGATIVE
Crystals, Ur, HPF, POC: NEGATIVE
Yeast, UA: NEGATIVE

## 2012-07-27 LAB — POCT GLYCOSYLATED HEMOGLOBIN (HGB A1C): Hemoglobin A1C: 6.9

## 2012-07-27 MED ORDER — METFORMIN HCL 500 MG PO TABS: 500.0000 mg | ORAL_TABLET | Freq: Two times a day (BID) | ORAL | Status: DC

## 2012-07-27 MED ORDER — LOSARTAN POTASSIUM 25 MG PO TABS: 25.0000 mg | ORAL_TABLET | Freq: Every day | ORAL | Status: DC

## 2012-07-27 MED ORDER — FLUOCINONIDE 0.05 % EX CREA: 1.0000 "application " | TOPICAL_CREAM | Freq: Two times a day (BID) | CUTANEOUS | Status: DC | PRN

## 2012-07-27 MED ORDER — GLIMEPIRIDE 2 MG PO TABS: 2.0000 mg | ORAL_TABLET | Freq: Every day | ORAL | Status: DC

## 2012-07-27 NOTE — Patient Instructions (Addendum)
Health Maintenance, Females A healthy lifestyle and preventative care can promote health and wellness.  Maintain regular health, dental, and eye exams.   Eat a healthy diet. Foods like vegetables, fruits, whole grains, low-fat dairy products, and lean protein foods contain the nutrients you need without too many calories. Decrease your intake of foods high in solid fats, added sugars, and salt. Get information about a proper diet from your caregiver, if necessary.   Regular physical exercise is one of the most important things you can do for your health. Most adults should get at least 150 minutes of moderate-intensity exercise (any activity that increases your heart rate and causes you to sweat) each week. In addition, most adults need muscle-strengthening exercises on 2 or more days a week.    Maintain a healthy weight. The body mass index (BMI) is a screening tool to identify possible weight problems. It provides an estimate of body fat based on height and weight. Your caregiver can help determine your BMI, and can help you achieve or maintain a healthy weight. For adults 20 years and older:   A BMI below 18.5 is considered underweight.   A BMI of 18.5 to 24.9 is normal.   A BMI of 25 to 29.9 is considered overweight.   A BMI of 30 and above is considered obese.   Maintain normal blood lipids and cholesterol by exercising and minimizing your intake of saturated fat. Eat a balanced diet with plenty of fruits and vegetables. Blood tests for lipids and cholesterol should begin at age 20 and be repeated every 5 years. If your lipid or cholesterol levels are high, you are over 50, or you are a high risk for heart disease, you may need your cholesterol levels checked more frequently.Ongoing high lipid and cholesterol levels should be treated with medicines if diet and exercise are not effective.   If you smoke, find out from your caregiver how to quit. If you do not use tobacco, do not start.    If you are pregnant, do not drink alcohol. If you are breastfeeding, be very cautious about drinking alcohol. If you are not pregnant and choose to drink alcohol, do not exceed 1 drink per day. One drink is considered to be 12 ounces (355 mL) of beer, 5 ounces (148 mL) of wine, or 1.5 ounces (44 mL) of liquor.   Avoid use of street drugs. Do not share needles with anyone. Ask for help if you need support or instructions about stopping the use of drugs.   High blood pressure causes heart disease and increases the risk of stroke. Blood pressure should be checked at least every 1 to 2 years. Ongoing high blood pressure should be treated with medicines, if weight loss and exercise are not effective.   If you are 55 to 66 years old, ask your caregiver if you should take aspirin to prevent strokes.   Diabetes screening involves taking a blood sample to check your fasting blood sugar level. This should be done once every 3 years, after age 45, if you are within normal weight and without risk factors for diabetes. Testing should be considered at a younger age or be carried out more frequently if you are overweight and have at least 1 risk factor for diabetes.   Breast cancer screening is essential preventative care for women. You should practice "breast self-awareness." This means understanding the normal appearance and feel of your breasts and may include breast self-examination. Any changes detected, no matter how   small, should be reported to a caregiver. Women in their 20s and 30s should have a clinical breast exam (CBE) by a caregiver as part of a regular health exam every 1 to 3 years. After age 40, women should have a CBE every year. Starting at age 40, women should consider having a mammogram (breast X-ray) every year. Women who have a family history of breast cancer should talk to their caregiver about genetic screening. Women at a high risk of breast cancer should talk to their caregiver about having  an MRI and a mammogram every year.   The Pap test is a screening test for cervical cancer. Women should have a Pap test starting at age 21. Between ages 21 and 29, Pap tests should be repeated every 2 years. Beginning at age 30, you should have a Pap test every 3 years as long as the past 3 Pap tests have been normal. If you had a hysterectomy for a problem that was not cancer or a condition that could lead to cancer, then you no longer need Pap tests. If you are between ages 65 and 70, and you have had normal Pap tests going back 10 years, you no longer need Pap tests. If you have had past treatment for cervical cancer or a condition that could lead to cancer, you need Pap tests and screening for cancer for at least 20 years after your treatment. If Pap tests have been discontinued, risk factors (such as a new sexual partner) need to be reassessed to determine if screening should be resumed. Some women have medical problems that increase the chance of getting cervical cancer. In these cases, your caregiver may recommend more frequent screening and Pap tests.   The human papillomavirus (HPV) test is an additional test that may be used for cervical cancer screening. The HPV test looks for the virus that can cause the cell changes on the cervix. The cells collected during the Pap test can be tested for HPV. The HPV test could be used to screen women aged 30 years and older, and should be used in women of any age who have unclear Pap test results. After the age of 30, women should have HPV testing at the same frequency as a Pap test.   Colorectal cancer can be detected and often prevented. Most routine colorectal cancer screening begins at the age of 50 and continues through age 75. However, your caregiver may recommend screening at an earlier age if you have risk factors for colon cancer. On a yearly basis, your caregiver may provide home test kits to check for hidden blood in the stool. Use of a small camera at  the end of a tube, to directly examine the colon (sigmoidoscopy or colonoscopy), can detect the earliest forms of colorectal cancer. Talk to your caregiver about this at age 50, when routine screening begins. Direct examination of the colon should be repeated every 5 to 10 years through age 75, unless early forms of pre-cancerous polyps or small growths are found.   Hepatitis C blood testing is recommended for all people born from 1945 through 1965 and any individual with known risks for hepatitis C.   Practice safe sex. Use condoms and avoid high-risk sexual practices to reduce the spread of sexually transmitted infections (STIs). Sexually active women aged 25 and younger should be checked for Chlamydia, which is a common sexually transmitted infection. Older women with new or multiple partners should also be tested for Chlamydia. Testing for other   STIs is recommended if you are sexually active and at increased risk.   Osteoporosis is a disease in which the bones lose minerals and strength with aging. This can result in serious bone fractures. The risk of osteoporosis can be identified using a bone density scan. Women ages 53 and over and women at risk for fractures or osteoporosis should discuss screening with their caregivers. Ask your caregiver whether you should be taking a calcium supplement or vitamin D to reduce the rate of osteoporosis.   Menopause can be associated with physical symptoms and risks. Hormone replacement therapy is available to decrease symptoms and risks. You should talk to your caregiver about whether hormone replacement therapy is right for you.   Use sunscreen with a sun protection factor (SPF) of 30 or greater. Apply sunscreen liberally and repeatedly throughout the day. You should seek shade when your shadow is shorter than you. Protect yourself by wearing long sleeves, pants, a wide-brimmed hat, and sunglasses year round, whenever you are outdoors.   Notify your caregiver  of new moles or changes in moles, especially if there is a change in shape or color. Also notify your caregiver if a mole is larger than the size of a pencil eraser.   Stay current with your immunizations.  Document Released: 06/30/2011 Document Revised: 12/04/2011 Document Reviewed: 06/30/2011 [facility] Patient Information 2012 [place], [place].Advance Directives (My Voice, My Choice) Advance directives are a means for you to make choices about your health care. It is a way that you may accept or refuse medical treatment if you cannot speak for yourself. An advance directive gives you a way to express your wishes about treatment choices in the event that you cannot speak for yourself. These directives protect your right to make your own health care choices. Some examples of advance directives would be:  A living will is a prepared document that designates your wishes in the event of a serious illness when you cannot care for yourself.   A patient advocate designation for health care means you choose someone who knows your wishes and can speak for you, on your behalf, should you not be able to do so yourself. This is often a close friend or family member.   Think about what is important for you in your life. To what extent do you want machines to keep you alive? How much pain are you willing to accept?   Decide what types of life-sustaining treatments you would or would not want.   Name a person to be your advocate who understands all your wishes and is willing and able to carry them out.   A durable power of attorney for health care is a formal legal agreement with an attorney or legal representative who will be bound to carry out your wishes in the event you are unable to care for or represent yourself. This should be someone you trust to make important medical decisions for you.   Do Not Resuscitate (DNR) is a request to do nothing in the event that your heart stops. A DNR order is used if you  are very ill and not expected to recover. DNR orders are accepted by nearly all caregivers and hospitals.  Most caregiver's offices and hospitals have advance directive forms you can use. You may cancel or change these documents at any time. You must be mentally sound and able to communicate your wishes at the time you fill out these forms. Regardless of how you let your final wishes  be known in the event of a terminal illness, make sure you discuss them with your family and friends. Copies should be given to your caregiver, your hospital, your advocate or attorney, and to significant others. If you travel, you may want to find out what is legal and binding in the states where you will be. Laws vary from state to state. Document Released: 02/23/2002 Document Revised: 12/04/2011 Document Reviewed: 08/22/2008   Patient ID: Regina Oneill MRN: [id number], DOB: 30-Aug-1946, 66 y.o. Date of Encounter: 07/27/2012, 1:20 PM  Primary Physician: Elvina Sidle, MD  Chief Complaint: Physical (CPE)  HPI: 66 y.o. y/o female with history of noted below here for CPE.  Doing well. No issues/complaints.  No further abdominal pain Gynecology:  Dr. Juliene Pina Feb 09, 2012 - removal of polyps Colonoscopy:  Dr. Loreta Ave 10/01/2011 MMG: 2012 Review of Systems Consitutional: No fever, chills, fatigue, night sweats, lymphadenopathy, or weight changes. Eyes: No visual changes, eye redness, or discharge. ENT/Mouth: Ears: No otalgia, tinnitus, hearing loss, discharge. Nose: No congestion, rhinorrhea, sinus pain, or epistaxis. Throat: No sore throat, post nasal drip, or teeth pain. Cardiovascular: No CP, palpitations, diaphoresis, DOE, edema, orthopnea, PND. Respiratory: No cough, hemoptysis, SOB, or wheezing. Gastrointestinal: No anorexia, dysphagia, reflux, pain, nausea, vomiting, hematemesis, diarrhea, constipation, BRBPR, or melena. Breast: No discharge, pain, swelling, or mass. Genitourinary: No dysuria, frequency,  urgency, hematuria, incontinence, nocturia, amenorrhea, vaginal discharge, pruritis, burning, abnormal bleeding, or pain. Musculoskeletal: No decreased ROM, myalgias, stiffness, joint swelling, or weakness. Skin: No rash, erythema, lesion changes, pain, warmth, jaundice, or pruritis. Neurological: No headache, dizziness, syncope, seizures, tremors, memory loss, coordination problems, or paresthesias. Psychological: No anxiety, depression, hallucinations, SI/HI. Endocrine: No fatigue, polydipsia, polyphagia, polyuria, or known diabetes. All other systems were reviewed and are otherwise negative.  Past Medical History  Diagnosis Date  . Arthritis     knee - no meds  . Diabetes mellitus     recently dx in 12/12  . Hypertension     recently dx in 12/12     Past Surgical History  Procedure Date  . Knee arthroscopy 06/2000    right  . Multiple tooth extractions     all teeth extracted  . Svd     x 3  . Cholecystectomy   . Colonoscopy   . Dilation and curettage of uterus   . Hysteroscopy w/d&c 02/09/2012    Procedure: DILATATION AND CURETTAGE /HYSTEROSCOPY;  Surgeon: Robley Fries, MD;  Location: [facility];  Service: Gynecology;  Laterality: N/A;  true clear    Home Meds:  Prior to Admission medications   Medication Sig Start Date End Date Taking? Authorizing Provider  aspirin EC 81 MG tablet Take 81 mg by mouth daily.   Yes Historical Provider, MD  fluocinonide cream (LIDEX) 0.05 % Apply 1 application topically 2 (two) times daily as needed. For eczema   Yes Historical Provider, MD  losartan (COZAAR) 25 MG tablet Take 1 tablet (25 mg total) by mouth daily. 05/14/12 05/14/13 Yes Gwenlyn Found Copland, MD  metFORMIN (GLUCOPHAGE) 500 MG tablet Take 500 mg by mouth 2 (two) times daily with a meal.   Yes Historical Provider, MD  glimepiride (AMARYL) 2 MG tablet Take 1 tablet (2 mg total) by mouth daily before breakfast. 03/25/12 03/25/13  Dois Davenport, MD  Hydrocodone-Homatropine 5-1.5 MG TABS  Take 1 tablet by mouth every 4 (four) hours as needed (cough). 03/03/12   Chelle S Jeffery, PA-C  sitaGLIPtin (JANUVIA) 100 MG tablet Take 100 mg  by mouth daily after supper. Pt takes at 8pm    Historical Provider, MD    Allergies: No Known Allergies  History   Social History  . Marital Status: Widowed    Spouse Name: N/A    Number of Children: N/A  . Years of Education: N/A   Occupational History  . Not on file.   Social History Main Topics  . Smoking status: Never Smoker   . Smokeless tobacco: Never Used  . Alcohol Use: No  . Drug Use: No  . Sexually Active: No   Other Topics Concern  . Not on file   Social History Narrative  . No narrative on file    No family history on file.  Physical Exam: Blood pressure 116/60, pulse 55, resp. rate 16., There is no height or weight on file to calculate BMI. General: Well developed, well nourished, in no acute distress. HEENT: Normocephalic, atraumatic. Conjunctiva pink, sclera non-icteric. Pupils 2 mm constricting to 1 mm, round, regular, and equally reactive to light and accomodation. EOMI. Internal auditory canal clear. TMs with good cone of light and without pathology. Nasal mucosa pink. Nares are without discharge. No sinus tenderness. Oral mucosa pink. Dentition wears dentures. Pharynx without exudate.   Neck: Supple. Trachea midline. No thyromegaly. Full ROM. No lymphadenopathy. Lungs: Clear to auscultation bilaterally without wheezes, rales, or rhonchi. Breathing is of normal effort and unlabored. Cardiovascular: RRR with S1 S2. No murmurs, rubs, or gallops appreciated. Distal pulses 2+ symmetrically. No carotid or abdominal bruits Breast: Symmetrical. No masses. Nipples without discharge. Abdomen: Soft, non-tender, non-distended with normoactive bowel sounds. No hepatosplenomegaly or masses. No rebound/guarding. No CVA tenderness. Without hernias.   Musculoskeletal: Full range of motion and 5/5 strength throughout. Without  swelling, atrophy, tenderness, crepitus, or warmth. Extremities without clubbing, cyanosis, or edema. Calves supple. Skin: Warm and moist without erythema, ecchymosis, wounds.  Eczematous patch on posterior neck. Neuro: A+Ox3. CN II-XII grossly intact. Moves all extremities spontaneously. Full sensation throughout. Normal gait. DTR 2+ throughout upper and lower extremities. Finger to nose intact. Psych:  Responds to questions appropriately with a normal affect.   Studies:   Assessment/Plan:  66 y.o. y/o female here for CPE  1. Hypertension  losartan (COZAAR) 25 MG tablet, Comprehensive metabolic panel, CBC  2. DM (diabetes mellitus)  metFORMIN (GLUCOPHAGE) 500 MG tablet, glimepiride (AMARYL) 2 MG tablet, POCT glycosylated hemoglobin (Hb A1C), Comprehensive metabolic panel, Lipid panel, POCT UA - Microscopic Only, POCT urinalysis dipstick  3. Eczema  fluocinonide cream (LIDEX) 0.05 %, POCT SEDIMENTATION RATE   -  Signed, Elvina Sidle, MD 07/27/2012 1:20 PM

## 2012-07-27 NOTE — Progress Notes (Signed)
@[email or web address] @  Patient ID: Regina Oneill MRN: [id number], DOB: [birth year], 66 y.o. Date of Encounter: 07/27/2012, 1:20 PM  Primary Physician: Elvina Sidle, MD  Chief Complaint: Physical (CPE)  HPI: 66 y.o. y/o female with history of noted below here for CPE.  Doing well. No issues/complaints.  No further abdominal pain Gynecology:  Dr. Juliene Pina Feb 09, 2012 - removal of polyps Colonoscopy:  Dr. Loreta Ave 10/01/2011 MMG: 2012 Review of Systems Consitutional: No fever, chills, fatigue, night sweats, lymphadenopathy, or weight changes. Eyes: No visual changes, eye redness, or discharge. ENT/Mouth: Ears: No otalgia, tinnitus, hearing loss, discharge. Nose: No congestion, rhinorrhea, sinus pain, or epistaxis. Throat: No sore throat, post nasal drip, or teeth pain. Cardiovascular: No CP, palpitations, diaphoresis, DOE, edema, orthopnea, PND. Respiratory: No cough, hemoptysis, SOB, or wheezing. Gastrointestinal: No anorexia, dysphagia, reflux, pain, nausea, vomiting, hematemesis, diarrhea, constipation, BRBPR, or melena. Breast: No discharge, pain, swelling, or mass. Genitourinary: No dysuria, frequency, urgency, hematuria, incontinence, nocturia, amenorrhea, vaginal discharge, pruritis, burning, abnormal bleeding, or pain. Musculoskeletal: No decreased ROM, myalgias, stiffness, joint swelling, or weakness. Skin: No rash, erythema, lesion changes, pain, warmth, jaundice, or pruritis. Neurological: No headache, dizziness, syncope, seizures, tremors, memory loss, coordination problems, or paresthesias. Psychological: No anxiety, depression, hallucinations, SI/HI. Endocrine: No fatigue, polydipsia, polyphagia, polyuria, or known diabetes. All other systems were reviewed and are otherwise negative.  Past Medical History  Diagnosis Date  . Arthritis     knee - no meds  . Diabetes mellitus     recently dx in 12/12  . Hypertension     recently dx in 12/12     Past Surgical History  Procedure Date    . Knee arthroscopy 06/2000    right  . Multiple tooth extractions     all teeth extracted  . Svd     x 3  . Cholecystectomy   . Colonoscopy   . Dilation and curettage of uterus   . Hysteroscopy w/d&c 02/09/2012    Procedure: DILATATION AND CURETTAGE /HYSTEROSCOPY;  Surgeon: Robley Fries, MD;  Location: [facility];  Service: Gynecology;  Laterality: N/A;  true clear    Home Meds:  Prior to Admission medications   Medication Sig Start Date End Date Taking? Authorizing Provider  aspirin EC 81 MG tablet Take 81 mg by mouth daily.   Yes Historical Provider, MD  fluocinonide cream (LIDEX) 0.05 % Apply 1 application topically 2 (two) times daily as needed. For eczema   Yes Historical Provider, MD  losartan (COZAAR) 25 MG tablet Take 1 tablet (25 mg total) by mouth daily. 05/14/12 05/14/13 Yes Gwenlyn Found Copland, MD  metFORMIN (GLUCOPHAGE) 500 MG tablet Take 500 mg by mouth 2 (two) times daily with a meal.   Yes Historical Provider, MD  glimepiride (AMARYL) 2 MG tablet Take 1 tablet (2 mg total) by mouth daily before breakfast. 03/25/12 03/25/13  Dois Davenport, MD  Hydrocodone-Homatropine 5-1.5 MG TABS Take 1 tablet by mouth every 4 (four) hours as needed (cough). 03/03/12   Chelle S Jeffery, PA-C  sitaGLIPtin (JANUVIA) 100 MG tablet Take 100 mg by mouth daily after supper. Pt takes at 8pm    Historical Provider, MD    Allergies: No Known Allergies  History   Social History  . Marital Status: Widowed    Spouse Name: N/A    Number of Children: N/A  . Years of Education: N/A   Occupational History  . Not on file.   Social History Main Topics  . Smoking status: Never  Smoker   . Smokeless tobacco: Never Used  . Alcohol Use: No  . Drug Use: No  . Sexually Active: No   Other Topics Concern  . Not on file   Social History Narrative  . No narrative on file    No family history on file.  Physical Exam: Blood pressure 116/60, pulse 55, resp. rate 16., There is no height or weight on  file to calculate BMI. General: Well developed, well nourished, in no acute distress. HEENT: Normocephalic, atraumatic. Conjunctiva pink, sclera non-icteric. Pupils 2 mm constricting to 1 mm, round, regular, and equally reactive to light and accomodation. EOMI. Internal auditory canal clear. TMs with good cone of light and without pathology. Nasal mucosa pink. Nares are without discharge. No sinus tenderness. Oral mucosa pink. Dentition wears dentures. Pharynx without exudate.   Neck: Supple. Trachea midline. No thyromegaly. Full ROM. No lymphadenopathy. Lungs: Clear to auscultation bilaterally without wheezes, rales, or rhonchi. Breathing is of normal effort and unlabored. Cardiovascular: RRR with S1 S2. No murmurs, rubs, or gallops appreciated. Distal pulses 2+ symmetrically. No carotid or abdominal bruits Breast: Symmetrical. No masses. Nipples without discharge. Abdomen: Soft, non-tender, non-distended with normoactive bowel sounds. No hepatosplenomegaly or masses. No rebound/guarding. No CVA tenderness. Without hernias.   Musculoskeletal: Full range of motion and 5/5 strength throughout. Without swelling, atrophy, tenderness, crepitus, or warmth. Extremities without clubbing, cyanosis, or edema. Calves supple. Skin: Warm and moist without erythema, ecchymosis, wounds.  Eczematous patch on posterior neck. Neuro: A+Ox3. CN II-XII grossly intact. Moves all extremities spontaneously. Full sensation throughout. Normal gait. DTR 2+ throughout upper and lower extremities. Finger to nose intact. Psych:  Responds to questions appropriately with a normal affect.   Studies:  Results for orders placed in visit on 07/27/12  POCT GLYCOSYLATED HEMOGLOBIN (HGB A1C)      Component Value Range   Hemoglobin A1C 6.9    POCT UA - MICROSCOPIC ONLY      Component Value Range   WBC, Ur, HPF, POC 0-5     RBC, urine, microscopic 0-6     Bacteria, U Microscopic neg     Mucus, UA small     Epithelial cells, urine  per micros 10-tntc     Crystals, Ur, HPF, POC neg     Casts, Ur, LPF, POC neg     Yeast, UA neg    POCT URINALYSIS DIPSTICK      Component Value Range   Color, UA yellow     Clarity, UA slightly cloudy     Glucose, UA neg     Bilirubin, UA neg     Ketones, UA trace     Spec Grav, UA 1.025     Blood, UA mod     pH, UA 5.5     Protein, UA neg     Urobilinogen, UA 0.2     Nitrite, UA neg     Leukocytes, UA Negative       Assessment/Plan:  66 y.o. y/o female here for CPE  1. Hypertension  losartan (COZAAR) 25 MG tablet, Comprehensive metabolic panel, CBC  2. DM (diabetes mellitus)  metFORMIN (GLUCOPHAGE) 500 MG tablet, glimepiride (AMARYL) 2 MG tablet, POCT glycosylated hemoglobin (Hb A1C), Comprehensive metabolic panel, Lipid panel, POCT UA - Microscopic Only, POCT urinalysis dipstick  3. Eczema  fluocinonide cream (LIDEX) 0.05 %, POCT SEDIMENTATION RATE   -  Signed, Elvina Sidle, MD 07/27/2012 1:20 PM

## 2012-07-28 LAB — COMPREHENSIVE METABOLIC PANEL
ALT: 12 U/L (ref 0–35)
AST: 12 U/L (ref 0–37)
Albumin: 3.9 g/dL (ref 3.5–5.2)
Alkaline Phosphatase: 92 U/L (ref 39–117)
BUN: 8 mg/dL (ref 6–23)
CO2: 30 mEq/L (ref 19–32)
Calcium: 9.3 mg/dL (ref 8.4–10.5)
Chloride: 100 mEq/L (ref 96–112)
Creat: 0.72 mg/dL (ref 0.50–1.10)
Glucose, Bld: 85 mg/dL (ref 70–99)
Potassium: 4.1 mEq/L (ref 3.5–5.3)
Sodium: 137 mEq/L (ref 135–145)
Total Bilirubin: 0.3 mg/dL (ref 0.3–1.2)
Total Protein: 7.4 g/dL (ref 6.0–8.3)

## 2012-07-28 LAB — LIPID PANEL
Cholesterol: 145 mg/dL (ref 0–200)
HDL: 38 mg/dL — ABNORMAL LOW (ref >39)
LDL Cholesterol: 68 mg/dL (ref 0–99)
Total CHOL/HDL Ratio: 3.8 Ratio
Triglycerides: 195 mg/dL — ABNORMAL HIGH (ref <150)
VLDL: 39 mg/dL (ref 0–40)

## 2012-08-05 ENCOUNTER — Telehealth: Payer: Self-pay

## 2012-08-05 NOTE — Telephone Encounter (Signed)
RX REFILL PT RECEIVE ON LAST VISIT IS SLIGHTLY DIFFERENT THAN PREVIOUS RX. SHE USED TO TAKE EXTENDED RELEASE METFORMIN AND WAS NOT GIVEN REGULAR. SHE IS WONDERING IF THAT IS CORRECT. SHE CALLED Monday BUT THERE IS NO RECORD OF THAT PHONE CALL?  [facility]ST [phone] WILL BE AT [facility]ERAL 11:30-1:30 Friday, BUT YOU CAN LEAVE A MESSAGE ON PHONE.  [facility] - [place] ([place]), [place] - [address]

## 2012-08-05 NOTE — Telephone Encounter (Signed)
PT SAYS METFORMIN RX RENEWAL SHE RECEIVED ON [id number] IS DIFFERENT THAN WHAT SHE HAD BEFORE. THEY USED TO BE EXTENDED RELEASE, NOW THEY ARE JUST REGULAR. SHE IS MAKING SURE THIS IS CORRECT.  BEST # [id number] DO NOT CALL BETWEEN 11:30-1:30 Friday SHE WILL BE AT A FUNERAL (YOU CAN LEAVE A MESSAGE)  [id number] - [place] ([place]), [place] - [address]

## 2012-08-06 ENCOUNTER — Telehealth: Payer: Self-pay

## 2012-08-06 MED ORDER — METFORMIN HCL ER 500 MG PO TB24: ORAL_TABLET | ORAL | Status: DC

## 2012-08-06 NOTE — Telephone Encounter (Signed)
Called pharmacist and explained change back to original Metformin XR 500 for pt's RFs for the year, but that pt doesn't need a RF now - she is going to take the reg that she has already p/up. Pharmacist verb understanding.

## 2012-08-06 NOTE — Telephone Encounter (Signed)
?   Okay to change to Metformin XR, I have called pharmacy to ck and see what her previous Rx was for, but they do not open until 9 am [phone]

## 2012-08-06 NOTE — Telephone Encounter (Signed)
Pharmacy would like a call for clarification on pt metfophormine 500 er please call pharmacy @ [phone]

## 2012-08-06 NOTE — Telephone Encounter (Signed)
Please continue the previous rx.  (this computer prescribing is very, very confusing)

## 2012-08-06 NOTE — Telephone Encounter (Signed)
Changed Rx back to Metformin XR and notified pt. She stated that she has already p/up the reg Metformin. Asked Dr L, and then called pt back to let her know that the reg should work just as well since she is taking it twice daily anyway, but then her RFs will be back to the orig XR. Pt agreed.

## 2012-08-06 NOTE — Telephone Encounter (Signed)
Called pharmacy who reported that pt's newest Rx sent at [facility] was for reg Metformin 500 BID, the last RF before that on 06/30/12 was for Metformin ER 500 also BID. Pt has already p/up the #180 for the regular. Dr L, did you intend the change or are you in agreement w/the change? Should we change the Rx back and have pt go back for the ER, or continue w/reg since she has it?

## 2012-11-17 ENCOUNTER — Ambulatory Visit (INDEPENDENT_AMBULATORY_CARE_PROVIDER_SITE_OTHER): Payer: Medicare Other | Admitting: Family Medicine

## 2012-11-17 VITALS — BP 150/75 | HR 70 | Temp 98.1°F | Resp 16 | Ht 64.0 in | Wt 216.0 lb

## 2012-11-17 DIAGNOSIS — E119 Type 2 diabetes mellitus without complications: Secondary | ICD-10-CM

## 2012-11-17 DIAGNOSIS — J40 Bronchitis, not specified as acute or chronic: Secondary | ICD-10-CM

## 2012-11-17 DIAGNOSIS — M1990 Unspecified osteoarthritis, unspecified site: Secondary | ICD-10-CM

## 2012-11-17 LAB — POCT GLYCOSYLATED HEMOGLOBIN (HGB A1C): Hemoglobin A1C: 6.3

## 2012-11-17 MED ORDER — HYDROCODONE-HOMATROPINE 5-1.5 MG/5ML PO SYRP: 5.0000 mL | ORAL_SOLUTION | Freq: Three times a day (TID) | ORAL | Status: DC | PRN

## 2012-11-17 MED ORDER — AZITHROMYCIN 250 MG PO TABS: ORAL_TABLET | ORAL | Status: DC

## 2012-11-17 MED ORDER — MELOXICAM 7.5 MG PO TABS: 7.5000 mg | ORAL_TABLET | Freq: Every day | ORAL | Status: DC

## 2012-11-17 NOTE — Progress Notes (Signed)
@[email or web address] @   Patient ID: ABI BAAS MRN: [id number], DOB: December 25, 1946, 66 y.o. Date of Encounter: 11/17/2012, 2:47 PM  Primary Physician: Elvina Sidle, MD  Chief Complaint:  Chief Complaint  Patient presents with  . Diabetes    a1c check  . Cough    x 5 days  . URI  . Hand Pain    R hand x 1 week  . Leg Pain    R leg x 1 week    HPI: 66 y.o. year old female presents with a 5 day history of nasal congestion, post nasal drip, sore throat, and cough. Mild sinus pressure. Afebrile. No chills. Nasal congestion thick and green/yellow. Cough is productive of green/yellow sputum and not associated with time of day. Ears feel full, leading to sensation of muffled hearing. Has tried OTC cold preps without success. No GI complaints.   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  No sick contacts, recent antibiotics, or recent travels.   No leg trauma, sedentary periods, h/o cancer, or tobacco use.  Patient is requesting Hgb A1C re: diabetes.  She has eaten some proscribed foods lately, and only occasionally checks her sugars.  None of the readings has been alarming, though.  Past Medical History  Diagnosis Date  . Arthritis     knee - no meds  . Diabetes mellitus     recently dx in 12/12  . Hypertension     recently dx in 12/12     Home Meds: Prior to Admission medications   Medication Sig Start Date End Date Taking? Authorizing Provider  aspirin EC 81 MG tablet Take 81 mg by mouth daily.   Yes Historical Provider, MD  fluocinonide cream (LIDEX) 0.05 % Apply 1 application topically 2 (two) times daily as needed. For eczema 07/27/12  Yes Elvina Sidle, MD  glimepiride (AMARYL) 2 MG tablet Take 1 tablet (2 mg total) by mouth daily before breakfast. 07/27/12 07/27/13 Yes Elvina Sidle, MD  losartan (COZAAR) 25 MG tablet Take 1 tablet (25 mg total) by mouth daily. 07/27/12 07/27/13 Yes Elvina Sidle, MD  metFORMIN (GLUCOPHAGE-XR) 500 MG 24 hr tablet  Take one tablet by mouth twice daily 08/06/12  Yes Elvina Sidle, MD    Allergies:  Allergies  Allergen Reactions  . Lisinopril Cough    History   Social History  . Marital Status: Widowed    Spouse Name: N/A    Number of Children: N/A  . Years of Education: N/A   Occupational History  . Not on file.   Social History Main Topics  . Smoking status: Never Smoker   . Smokeless tobacco: Never Used  . Alcohol Use: No  . Drug Use: No  . Sexually Active: No   Other Topics Concern  . Not on file   Social History Narrative  . No narrative on file     Review of Systems: Constitutional: negative for chills, fever, night sweats or weight changes Cardiovascular: negative for chest pain or palpitations Respiratory: negative for hemoptysis, wheezing, or shortness of breath Abdominal: negative for abdominal pain, nausea, vomiting or diarrhea Dermatological: negative for rash Neurologic: negative for headache Positive for pain in right hand and knee for a week.  The hand is mildly swollen over index and middle finger MCP's.  No prior hand pain.  She's tried linament without much help.  The knee has hurt before, aching all the time now.  Physical Exam: Blood pressure 150/75, pulse 70, temperature 98.1 F (  36.7 C), temperature source Oral, resp. rate 16, height 5\' 4"  (1.626 m), weight 216 lb (97.977 kg)., Body mass index is 37.08 kg/(m^2). General: Well developed, well nourished, in no acute distress. Head: Normocephalic, atraumatic, eyes without discharge, sclera non-icteric, nares are congested. Bilateral auditory canals clear, TM's are partially obscured by wax, pearly grey with reflective cone of light bilaterally. No sinus TTP. Oral cavity moist, dentition normal. Posterior pharynx with post nasal drip and mild erythema. No peritonsillar abscess or tonsillar exudate. Neck: Supple. No thyromegaly. Full ROM. No lymphadenopathy. Lungs: Coarse breath sounds bilaterally without wheezes,  rales, or rhonchi. Breathing is unlabored.  Heart: RRR with S1 S2. No murmurs, rubs, or gallops appreciated. Msk:  Strength and tone normal for age. Extremities: No clubbing or cyanosis. No edema. Patient does have mild swelling around the right index finger and middle finger MCP joints with tenderness. There is no effusion in the right knee but there is some mild soft tissue swelling surrounding the patella. She has full range of motion of both of these areas. Neuro: Alert and oriented X 3. Moves all extremities spontaneously. CNII-XII grossly in tact. Psych:  Responds to questions appropriately with a normal affect.   Labs:  Sed rate = 66 last 07/27/2012 suggesting arthritis is present Results for orders placed in visit on 11/17/12  POCT GLYCOSYLATED HEMOGLOBIN (HGB A1C)      Component Value Range   Hemoglobin A1C 6.3       ASSESSMENT AND PLAN:  66 y.o. year old female with bronchitis, inflammatory arthritis, and type 2 diabetes, controlled. 1. Bronchitis  azithromycin (ZITHROMAX Z-PAK) 250 MG tablet, HYDROcodone-homatropine (HYCODAN) 5-1.5 MG/5ML syrup  2. Diabetes type 2, controlled  POCT glycosylated hemoglobin (Hb A1C)  3. Arthritis  meloxicam (MOBIC) 7.5 MG tablet    Signed, Elvina Sidle, MD 11/17/2012 2:47 PM

## 2012-12-09 ENCOUNTER — Other Ambulatory Visit: Payer: Self-pay | Admitting: Family Medicine

## 2012-12-13 ENCOUNTER — Telehealth: Payer: Self-pay

## 2012-12-13 NOTE — Telephone Encounter (Signed)
Unfortunately, she would need to be evaluated before we can refill these medications.

## 2012-12-13 NOTE — Telephone Encounter (Signed)
Spoke with patient she would like a refill on Flagyl and Cipro for her diverticulitis.

## 2012-12-13 NOTE — Telephone Encounter (Signed)
Patient advised.

## 2012-12-13 NOTE — Telephone Encounter (Signed)
Patient states that [facility] sent rx request on Thursday for diverticulitis medication to be refilled. I don't see anything about it. Please contact patient at [phone] [facility] on [address]

## 2013-01-01 ENCOUNTER — Ambulatory Visit (INDEPENDENT_AMBULATORY_CARE_PROVIDER_SITE_OTHER): Payer: Medicare Other | Admitting: Emergency Medicine

## 2013-01-01 VITALS — BP 138/78 | HR 63 | Temp 98.0°F | Resp 16 | Ht 64.0 in | Wt 219.2 lb

## 2013-01-01 DIAGNOSIS — K5792 Diverticulitis of intestine, part unspecified, without perforation or abscess without bleeding: Secondary | ICD-10-CM

## 2013-01-01 DIAGNOSIS — R109 Unspecified abdominal pain: Secondary | ICD-10-CM

## 2013-01-01 DIAGNOSIS — E119 Type 2 diabetes mellitus without complications: Secondary | ICD-10-CM

## 2013-01-01 DIAGNOSIS — K5732 Diverticulitis of large intestine without perforation or abscess without bleeding: Secondary | ICD-10-CM

## 2013-01-01 LAB — POCT CBC
Granulocyte percent: 65.5 %G (ref 37–80)
Hemoglobin: 12.9 g/dL (ref 12.2–16.2)
MCV: 86.9 fL (ref 80–97)
MID (cbc): 0.6 (ref 0–0.9)
Platelet Count, POC: 475 10*3/uL — AB (ref 142–424)
RBC: 4.98 M/uL (ref 4.04–5.48)

## 2013-01-01 LAB — POCT URINALYSIS DIPSTICK
Ketones, UA: NEGATIVE
Leukocytes, UA: NEGATIVE
Nitrite, UA: NEGATIVE
Protein, UA: NEGATIVE
Urobilinogen, UA: 1
pH, UA: 6.5

## 2013-01-01 LAB — POCT UA - MICROSCOPIC ONLY

## 2013-01-01 MED ORDER — CIPROFLOXACIN HCL 250 MG PO TABS: 250.0000 mg | ORAL_TABLET | Freq: Two times a day (BID) | ORAL | Status: DC

## 2013-01-01 MED ORDER — METRONIDAZOLE 500 MG PO TABS: 500.0000 mg | ORAL_TABLET | Freq: Two times a day (BID) | ORAL | Status: DC

## 2013-01-01 NOTE — Progress Notes (Signed)
  Subjective:    Patient ID: Regina Oneill, female    DOB: September 20, 1946, 67 y.o.   MRN: [id number]  HPI patient enters with onset one week ago of left lower quadrant abdominal pain. She states she has not had any definite fever. She had a colonoscopy done in 2012. She states she's been treated for diverticulitis in the past and this feels like a flareup of her disease. She denies burning stinging or pain on urination.    Review of Systems     Objective:   Physical Exam H. EENT exam is unremarkable. Neck is supple. Chest is clear to both auscultation and percussion. Cardiac exam has a regular rate without murmur the abdomen is flat there are bowel sounds present there is significant tenderness deep in the left lower quadrant without rebound. Results for orders placed in visit on 01/01/13  POCT URINALYSIS DIPSTICK      Component Value Range   Color, UA yellow     Clarity, UA clear     Glucose, UA neg     Bilirubin, UA neg     Ketones, UA neg     Spec Grav, UA 1.020     Blood, UA small     pH, UA 6.5     Protein, UA neg     Urobilinogen, UA 1.0     Nitrite, UA neg     Leukocytes, UA Negative    POCT CBC      Component Value Range   WBC 11.7 (*) 4.6 - 10.2 K/uL   Lymph, poc 3.5 (*) 0.6 - 3.4   POC LYMPH PERCENT 29.5  10 - 50 %L   MID (cbc) 0.6  0 - 0.9   POC MID % 5.0  0 - 12 %M   POC Granulocyte 7.7 (*) 2 - 6.9   Granulocyte percent 65.5  37 - 80 %G   RBC 4.98  4.04 - 5.48 M/uL   Hemoglobin 12.9  12.2 - 16.2 g/dL   HCT, POC 40.9  81.1 - 47.9 %   MCV 86.9  80 - 97 fL   MCH, POC 25.9 (*) 27 - 31.2 pg   MCHC 29.8 (*) 31.8 - 35.4 g/dL   RDW, POC 91.4     Platelet Count, POC 475 (*) 142 - 424 K/uL   MPV 8.1  0 - 99.8 fL  GLUCOSE, POCT (MANUAL RESULT ENTRY)      Component Value Range   POC Glucose 66 (*) 70 - 99 mg/dl  POCT UA - MICROSCOPIC ONLY      Component Value Range   WBC, Ur, HPF, POC 0-2     RBC, urine, microscopic 4-6     Bacteria, U Microscopic 1+     Mucus, UA  pos     Epithelial cells, urine per micros 3-4     Crystals, Ur, HPF, POC neg     Casts, Ur, LPF, POC neg     Yeast, UA neg           Assessment & Plan:  History and physical are most consistent with diverticulitis. We'll check baseline CBC and urine for now. She had a colonoscopy by Dr. Loreta Ave October 2012. Which showed only diverticulosis. Patient alerted to signs of perforation. We'll treat with Cipro and.Flagyl .

## 2013-01-01 NOTE — Patient Instructions (Signed)
If you developDiverticulitis A diverticulum is a small pouch or sac on the colon. Diverticulosis is the presence of these diverticula on the colon. Diverticulitis is the irritation (inflammation) or infection of diverticula. CAUSES  The colon and its diverticula contain bacteria. If food particles block the tiny opening to a diverticulum, the bacteria inside can grow and cause an increase in pressure. This leads to infection and inflammation and is called diverticulitis. SYMPTOMS   Abdominal pain and tenderness. Usually, the pain is located on the left side of your abdomen. However, it could be located elsewhere.  Fever.  Bloating.  Feeling sick to your stomach (nausea).  Throwing up (vomiting).  Abnormal stools. DIAGNOSIS  Your caregiver will take a history and perform a physical exam. Since many things can cause abdominal pain, other tests may be necessary. Tests may include:  Blood tests.  Urine tests.  X-ray of the abdomen.  CT scan of the abdomen. Sometimes, surgery is needed to determine if diverticulitis or other conditions are causing your symptoms. TREATMENT  Most of the time, you can be treated without surgery. Treatment includes:  Resting the bowels by only having liquids for a few days. As you improve, you will need to eat a low-fiber diet.  Intravenous (IV) fluids if you are losing body fluids (dehydrated).  Antibiotic medicines that treat infections may be given.  Pain and nausea medicine, if needed.  Surgery if the inflamed diverticulum has burst. HOME CARE INSTRUCTIONS   Try a clear liquid diet (broth, tea, or water for as long as directed by your caregiver). You may then gradually begin a low-fiber diet as tolerated. A low-fiber diet is a diet with less than 10 grams of fiber. Choose the foods below to reduce fiber in the diet:  White breads, cereals, rice, and pasta.  Cooked fruits and vegetables or soft fresh fruits and vegetables without the  skin.  Ground or well-cooked tender beef, ham, veal, lamb, pork, or poultry.  Eggs and seafood.  After your diverticulitis symptoms have improved, your caregiver may put you on a high-fiber diet. A high-fiber diet includes 14 grams of fiber for every 1000 calories consumed. For a standard 2000 calorie diet, you would need 28 grams of fiber. Follow these diet guidelines to help you increase the fiber in your diet. It is important to slowly increase the amount fiber in your diet to avoid gas, constipation, and bloating.  Choose whole-grain breads, cereals, pasta, and brown rice.  Choose fresh fruits and vegetables with the skin on. Do not overcook vegetables because the more vegetables are cooked, the more fiber is lost.  Choose more nuts, seeds, legumes, dried peas, beans, and lentils.  Look for food products that have greater than 3 grams of fiber per serving on the Nutrition Facts label.  Take all medicine as directed by your caregiver.  If your caregiver has given you a follow-up appointment, it is very important that you go. Not going could result in lasting (chronic) or permanent injury, pain, and disability. If there is any problem keeping the appointment, call to reschedule. SEEK MEDICAL CARE IF:   Your pain does not improve.  You have a hard time advancing your diet beyond clear liquids.  Your bowel movements do not return to normal. SEEK IMMEDIATE MEDICAL CARE IF:   Your pain becomes worse.  You have an oral temperature above 102 F (38.9 C), not controlled by medicine.  You have repeated vomiting.  You have bloody or black, tarry stools.  Symptoms that brought you to your caregiver become worse or are not getting better. MAKE SURE YOU:   Understand these instructions.  Will watch your condition.  Will get help right away if you are not doing well or get worse. Document Released: 09/24/2005 Document Revised: 03/08/2012 Document Reviewed: 01/20/2011 [facility]  Patient Information 2013 [place], [place].  severe abdominal pain or abdominal distention please go to the emergency room for reevaluation

## 2013-02-01 ENCOUNTER — Telehealth: Payer: Self-pay

## 2013-02-01 DIAGNOSIS — L309 Dermatitis, unspecified: Secondary | ICD-10-CM

## 2013-02-01 DIAGNOSIS — E119 Type 2 diabetes mellitus without complications: Secondary | ICD-10-CM

## 2013-02-01 MED ORDER — FLUOCINONIDE 0.05 % EX CREA: 1.0000 "application " | TOPICAL_CREAM | Freq: Two times a day (BID) | CUTANEOUS | Status: DC | PRN

## 2013-02-01 MED ORDER — METFORMIN HCL ER 500 MG PO TB24: ORAL_TABLET | ORAL | Status: DC

## 2013-02-01 MED ORDER — GLIMEPIRIDE 2 MG PO TABS: 2.0000 mg | ORAL_TABLET | Freq: Every day | ORAL | Status: DC

## 2013-02-01 NOTE — Telephone Encounter (Signed)
Remaining refills resubmitted. Called her to advise. Unsure why pharmacy would not transfer.

## 2013-02-01 NOTE — Telephone Encounter (Signed)
PATIENT CALLED STATING SHE HAS A NEW INSURANCE AND THEY WILL NOT ACCEPT THE CURRENT PRESCRIPTIONS THAT SHE HAS AND SHE NEEDS A NEW PERSCRIPTION WRITTEN FOR HER NEW INSURANCE.  SHE SAYS SHE GETS THE 90 DAY SUPPLY AND NEEDS GLIMEPIRIDE, FLUOCINONIDE, AND METFORMIN.  SHE IS HAS MOVED AND IS CHANGING TO THE [facility] ON [address].  PLEASE CALL [phone]

## 2013-02-12 ENCOUNTER — Other Ambulatory Visit: Payer: Self-pay

## 2013-02-18 ENCOUNTER — Telehealth: Payer: Self-pay

## 2013-02-18 MED ORDER — LOSARTAN POTASSIUM 25 MG PO TABS: 25.0000 mg | ORAL_TABLET | Freq: Every day | ORAL | Status: DC

## 2013-02-18 NOTE — Telephone Encounter (Signed)
Called her, sent this in, she did not request this one previously remaining refill sent

## 2013-02-18 NOTE — Telephone Encounter (Signed)
PT STATES SHE RECEIVED ALL HER MEDS EXCEPT HER LOSARTAN WITH A 90 DAY SUPPLY. REALLY NEED TO HAVE ASAP PLEASE CALL [phone]   [facility] [address]

## 2013-03-14 ENCOUNTER — Encounter: Payer: Self-pay | Admitting: Family Medicine

## 2013-03-30 ENCOUNTER — Ambulatory Visit (INDEPENDENT_AMBULATORY_CARE_PROVIDER_SITE_OTHER): Payer: Medicare Other | Admitting: Family Medicine

## 2013-03-30 VITALS — BP 132/82 | HR 89 | Temp 98.0°F | Resp 17 | Ht 63.0 in | Wt 216.0 lb

## 2013-03-30 DIAGNOSIS — J209 Acute bronchitis, unspecified: Secondary | ICD-10-CM

## 2013-03-30 DIAGNOSIS — IMO0002 Reserved for concepts with insufficient information to code with codable children: Secondary | ICD-10-CM

## 2013-03-30 DIAGNOSIS — M1710 Unilateral primary osteoarthritis, unspecified knee: Secondary | ICD-10-CM

## 2013-03-30 DIAGNOSIS — M1711 Unilateral primary osteoarthritis, right knee: Secondary | ICD-10-CM

## 2013-03-30 DIAGNOSIS — J069 Acute upper respiratory infection, unspecified: Secondary | ICD-10-CM

## 2013-03-30 DIAGNOSIS — J208 Acute bronchitis due to other specified organisms: Secondary | ICD-10-CM

## 2013-03-30 MED ORDER — HYDROCODONE-HOMATROPINE 5-1.5 MG/5ML PO SYRP: 5.0000 mL | ORAL_SOLUTION | ORAL | Status: DC | PRN

## 2013-03-30 MED ORDER — BENZONATATE 100 MG PO CAPS: 100.0000 mg | ORAL_CAPSULE | Freq: Three times a day (TID) | ORAL | Status: DC | PRN

## 2013-03-30 NOTE — Patient Instructions (Addendum)
Drink plenty of fluids  Take a generic Claritin-D one daily for the congestion in the head and drainage  Try to get plenty of rest  Use the cough pills, tessalon, in the daytime  Use the hydrocodone cough syrup at night  Return in worse

## 2013-03-30 NOTE — Progress Notes (Signed)
Subjective: 67 year old lady with history of having been sick since Sunday with a respiratory tract infection. She has sore throat and cough primarily. Some runny nose. Ears are okay. She has not had any documented fever. She does cough up some phlegm. Coughing is bothering her somewhat  Needs a parking permit for her arthritic knee.  Objective: She has been coughing a good deal since we've been in the exam room. Her TMs are normal. Throat not particularly erythematous. Neck supple without significant nodes. Chest is clear without any rhonchi rales or wheezes. Heart regular without murmurs. No CVA tenderness.  Assessment: URI with bronchitis, viral  Plan: Per instructions. Tessalon and Hycodan. Take Claritin-D. Encourage fluids and rest.  Patient also has problems with her knee. She's had surgery on it, and it is arthritic it hurts. She has limited mobility from it. We will give her a disability parking placard application form. Will qualify her for the arthritic component. Advise use of a walking cane

## 2013-05-19 IMAGING — CT CT ABD-PELV W/ CM
2 of 5 series · 16 of 46 positions shown, 18 images · IV contrast (30CC OMNI 300 & [ID] OMNI 300)
Comparison: None

CLINICAL DATA: Left lower quadrant pain, elevated white blood cell
count

CT ABDOMEN AND PELVIS WITH CONTRAST
TECHNIQUE: Multidetector CT imaging of the abdomen and pelvis was
performed following the standard protocol during bolus
administration of intravenous contrast.
Contrast: 20mL OMNIPAQUE IOHEXOL 300 MG/ML  SOLN, 125mL OMNIPAQUE
IOHEXOL 300 MG/ML  SOLN

[Series 2: abd/pelvis with · axial · 0.83mm/px · z∈[-270,+125]mm · 13 of 89 slices shown, 15 images]
[im 5/89  soft-tissue]
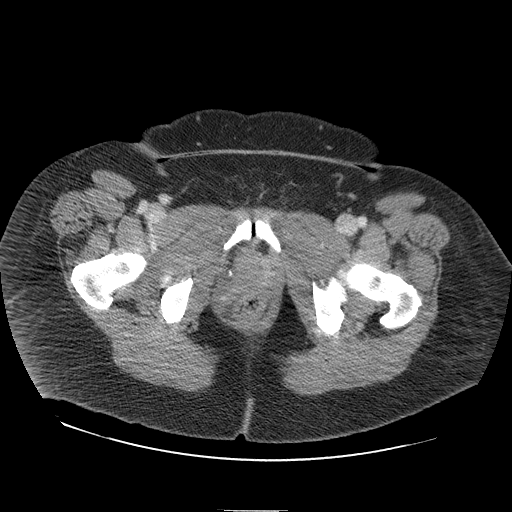
[im 5/89  bone]
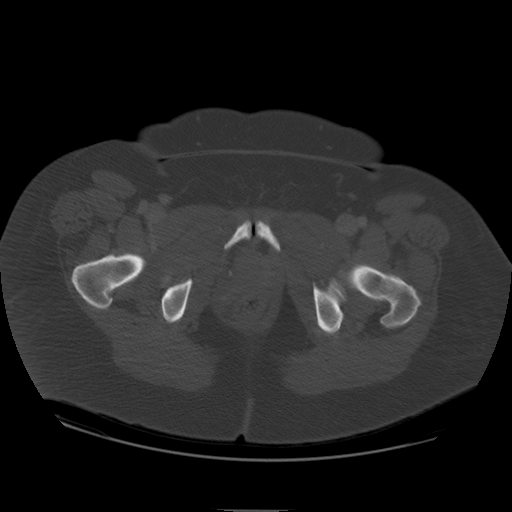
[im 14/89  soft-tissue]
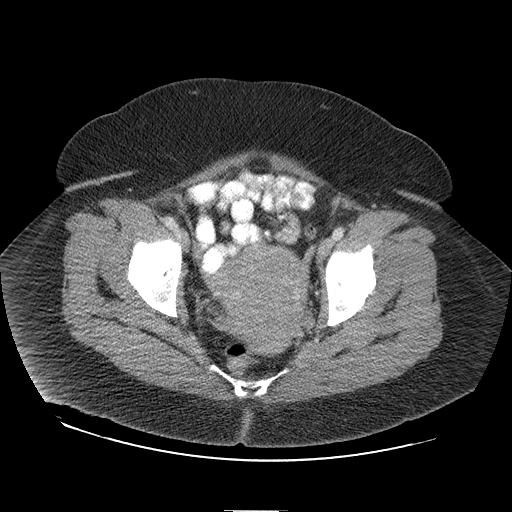
[im 19/89  soft-tissue]
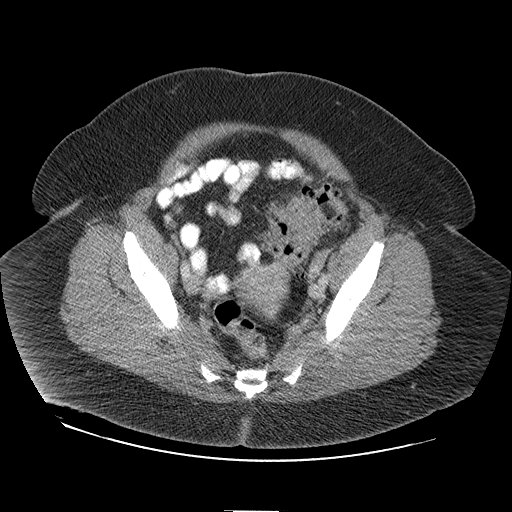
[im 24/89  soft-tissue]
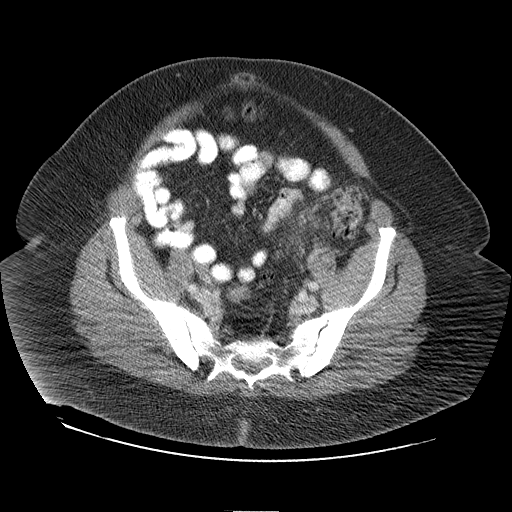
[im 33/89  soft-tissue]
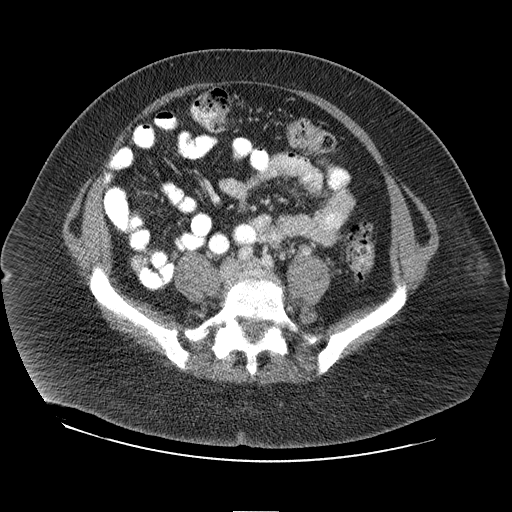
[im 38/89  soft-tissue]
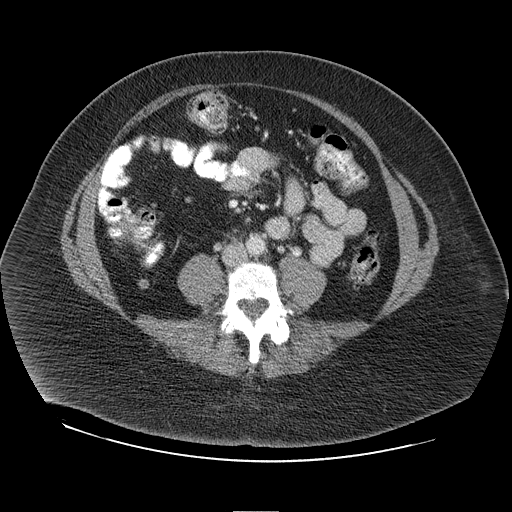
[im 47/89  soft-tissue]
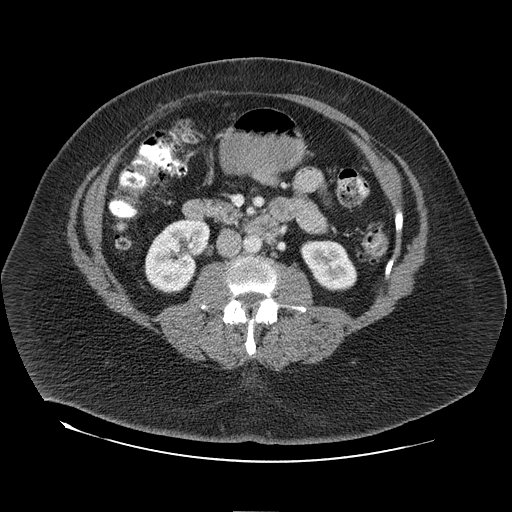
[im 51/89  soft-tissue]
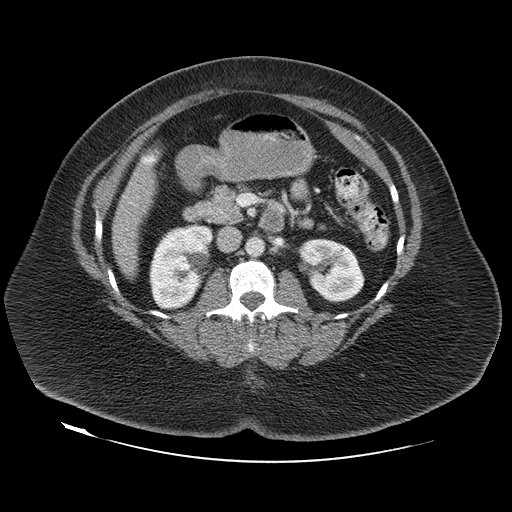
[im 56/89  soft-tissue]
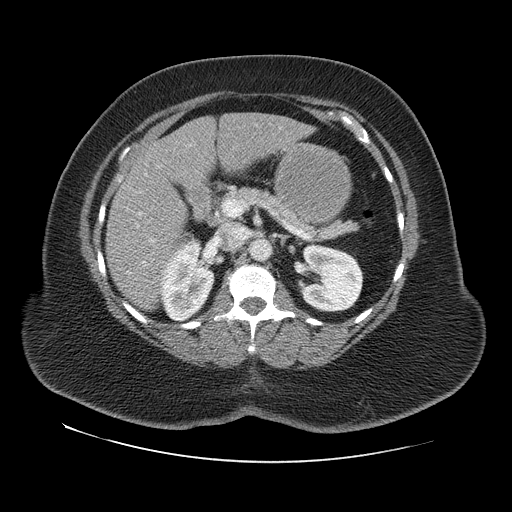
[im 56/89  bone]
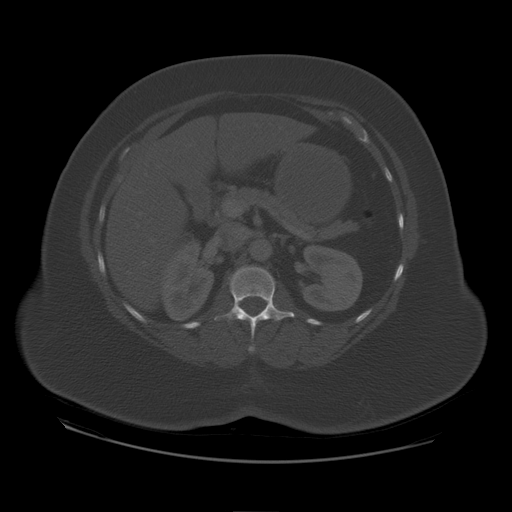
[im 65/89  soft-tissue]
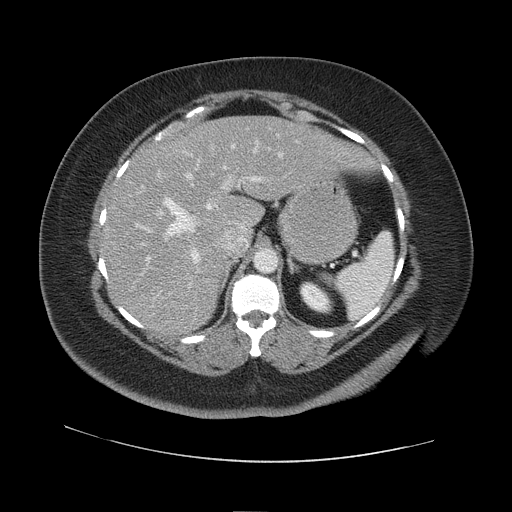
[im 70/89  soft-tissue]
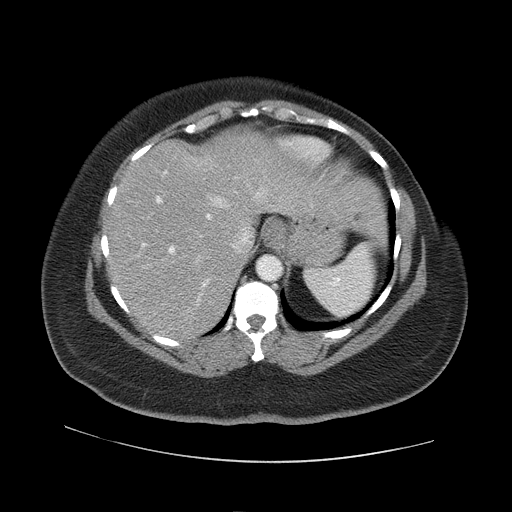
[im 75/89  soft-tissue]
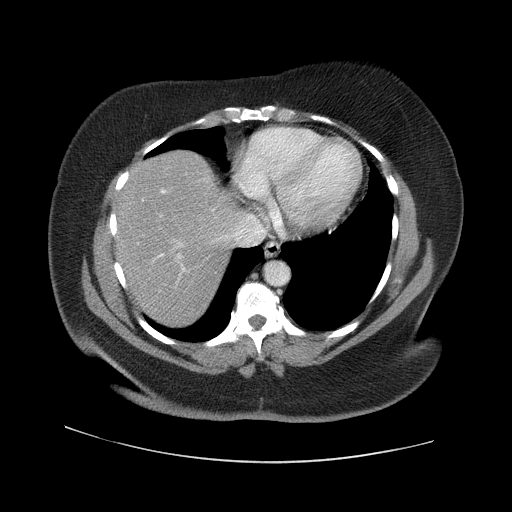
[im 84/89  soft-tissue]
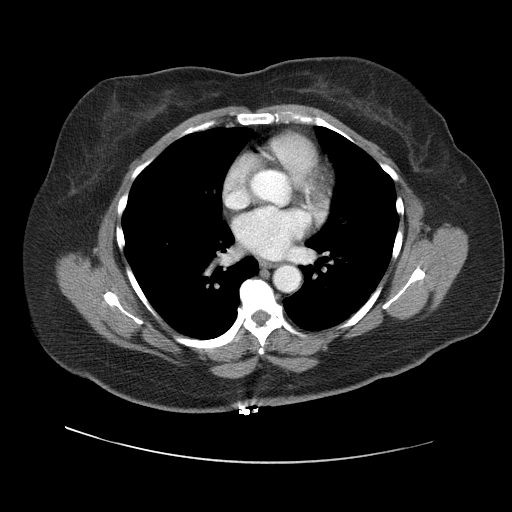

[Series 400: cor · coronal · 0.93mm/px · 3 of 140 slices shown]
[im 47/140  soft-tissue]
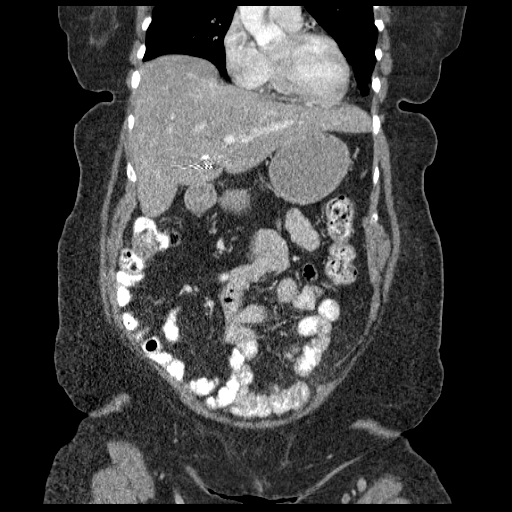
[im 62/140  soft-tissue]
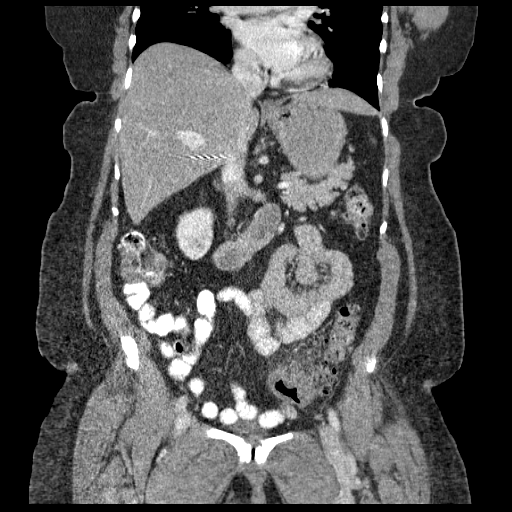
[im 78/140  soft-tissue]
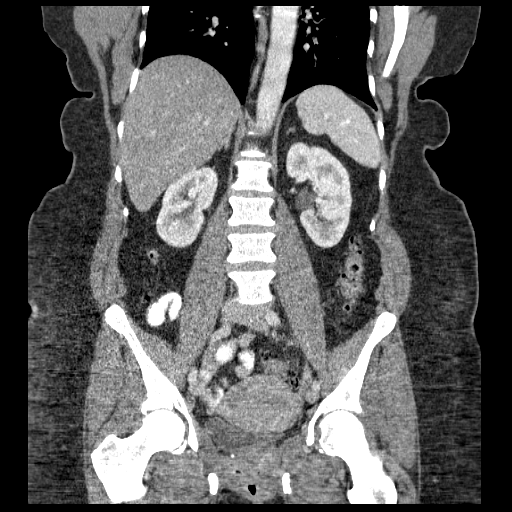

[16 of 46 positions shown; findings below may reference images not displayed]

BUN and creatinine were obtained on site at [HOSPITAL] at
[HOSPITAL]..
Results:  BUN 6 mg/dL,  Creatinine 0.6 mg/dL.
FINDINGS: The lung bases are clear.  The liver is somewhat low in
attenuation which may indicate fatty infiltration.  No focal
abnormality is seen.  Surgical clips are present from prior
cholecystectomy.  The pancreas is normal in size and the pancreatic
duct is not dilated.  The adrenal glands and spleen are
unremarkable.  The stomach is moderately fluid distended with no
abnormality noted.  The kidneys enhance with no calculus or mass
and a small cyst is noted in the periphery of the right mid upper
kidney.  The abdominal aorta is normal in caliber.  No adenopathy
is seen.

There is edema of the mucosa of the rectosigmoid colon with some
pericolonic strandiness, consistent with sigmoid colon
diverticulitis.  Multiple sigmoid colon diverticula are present.
No abscess is seen and no evidence of perforation is noted.
Diverticula are scattered throughout the remainder of the colon as
well.  The terminal ileum and appendix are unremarkable.  No fluid
is seen within the pelvis.  The uterus is slightly prominent which
may see represent uterine fibroids.  No adnexal lesion is seen.
The urinary bladder is decompressed.  Stress sclerosis is noted
along the SI joints bilaterally.  There is degenerative disc
disease at the L5-S1 level.
IMPRESSION: 1.  Uncomplicated sigmoid colon diverticulitis.  No abscess.  No
evidence of perforation.
2.  Multiple colonic diverticula primarily within the rectosigmoid
colon.
3.  Probable fatty infiltration of the liver.
4.  Degenerative disc disease at L5-S1.
5.  Slightly prominent uterus for age.  Question fibroids.

I discussed the findings of this study with Dr. Aujla at [DATE]
p.m. on 05/26/2012

## 2013-05-19 IMAGING — CR DG ABDOMEN 2V
3 series · 3 of 3 positions shown · non-contrast
Comparison: Chest x-ray dated 05/14/2012

CLINICAL DATA: Lower abdominal pain and vomiting.

ABDOMEN - 2 VIEW

[AP (1 of 2)]
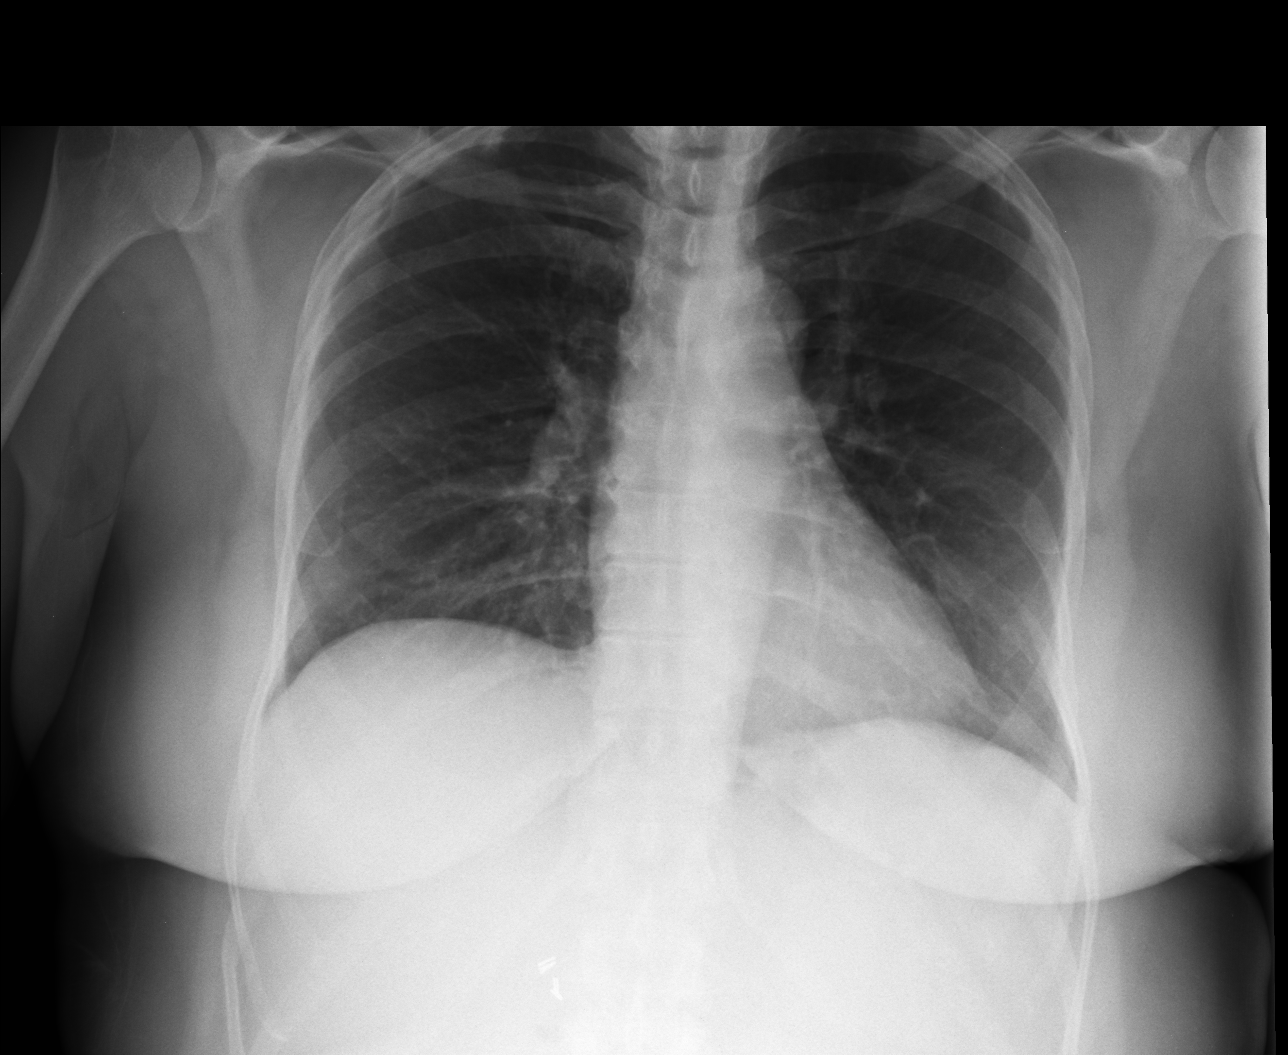

[AP (2 of 2)]
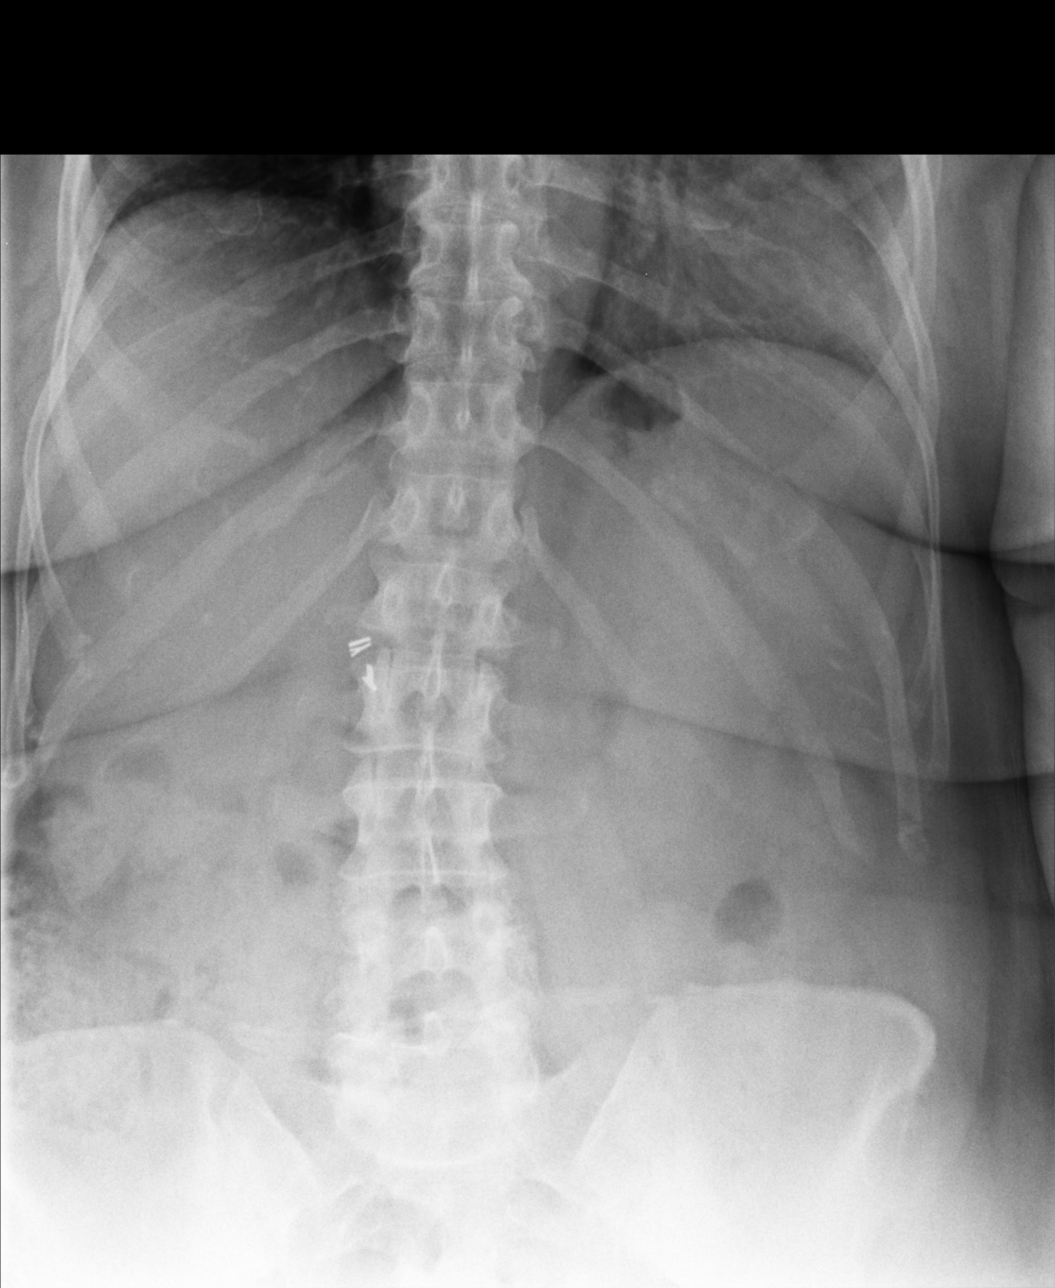

[ap lld]
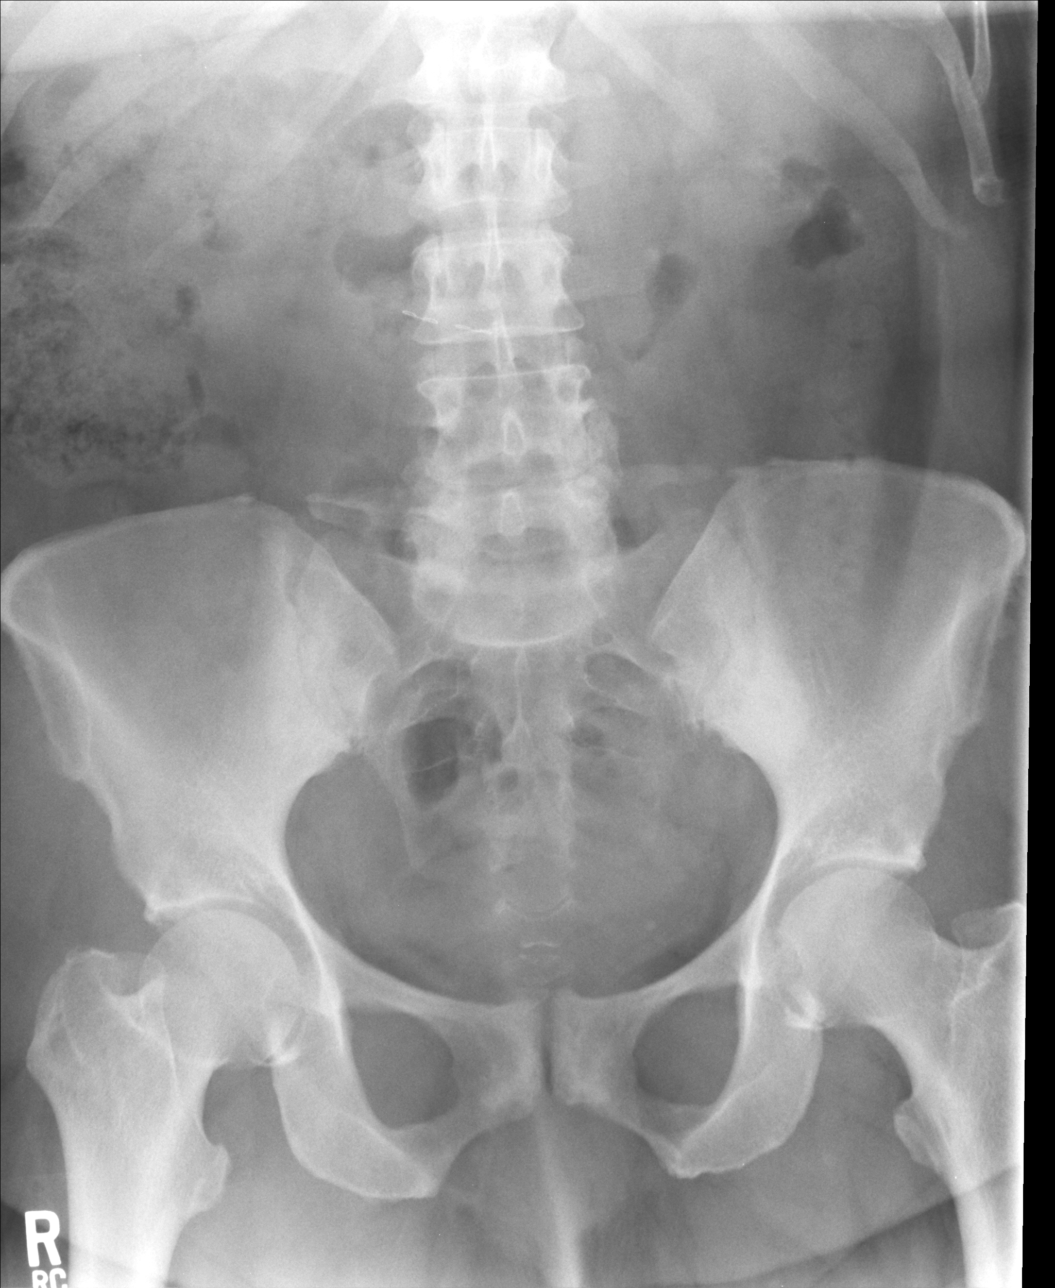

[3 of 3 positions shown; findings below may reference images not displayed]

FINDINGS: Heart and lungs appear normal.  Bilateral cervical ribs.

No free air or free fluid in the abdomen.  Evidence of prior
cholecystectomy.  Sclerosis of the sacroiliac joints and symphysis
pubis, probably secondary to  previous childbirth.  No acute
osseous abnormality.
IMPRESSION: Benign-appearing abdomen and chest.

Clinically significant discrepancy from primary report, if
provided: None

## 2013-08-03 ENCOUNTER — Other Ambulatory Visit: Payer: Self-pay

## 2013-10-13 ENCOUNTER — Encounter: Payer: Self-pay | Admitting: Family Medicine

## 2013-10-13 ENCOUNTER — Ambulatory Visit (INDEPENDENT_AMBULATORY_CARE_PROVIDER_SITE_OTHER): Payer: Medicare Other | Admitting: Family Medicine

## 2013-10-13 VITALS — BP 119/69 | HR 62 | Temp 97.9°F | Resp 16 | Ht 63.5 in | Wt 214.0 lb

## 2013-10-13 DIAGNOSIS — R319 Hematuria, unspecified: Secondary | ICD-10-CM

## 2013-10-13 DIAGNOSIS — L989 Disorder of the skin and subcutaneous tissue, unspecified: Secondary | ICD-10-CM

## 2013-10-13 DIAGNOSIS — I10 Essential (primary) hypertension: Secondary | ICD-10-CM

## 2013-10-13 DIAGNOSIS — Z0000 Encounter for general adult medical examination without abnormal findings: Secondary | ICD-10-CM

## 2013-10-13 DIAGNOSIS — Z23 Encounter for immunization: Secondary | ICD-10-CM

## 2013-10-13 DIAGNOSIS — E119 Type 2 diabetes mellitus without complications: Secondary | ICD-10-CM

## 2013-10-13 DIAGNOSIS — R21 Rash and other nonspecific skin eruption: Secondary | ICD-10-CM

## 2013-10-13 LAB — COMPREHENSIVE METABOLIC PANEL
ALT: 10 U/L (ref 0–35)
AST: 14 U/L (ref 0–37)
Albumin: 4 g/dL (ref 3.5–5.2)
Alkaline Phosphatase: 98 U/L (ref 39–117)
BUN: 10 mg/dL (ref 6–23)
CO2: 32 mEq/L (ref 19–32)
Calcium: 9.6 mg/dL (ref 8.4–10.5)
Chloride: 99 mEq/L (ref 96–112)
Creat: 0.81 mg/dL (ref 0.50–1.10)
Glucose, Bld: 117 mg/dL — ABNORMAL HIGH (ref 70–99)
Potassium: 4.4 mEq/L (ref 3.5–5.3)
Sodium: 138 mEq/L (ref 135–145)
Total Bilirubin: 0.8 mg/dL (ref 0.3–1.2)
Total Protein: 7.6 g/dL (ref 6.0–8.3)

## 2013-10-13 LAB — POCT URINALYSIS DIPSTICK
Bilirubin, UA: NEGATIVE
Glucose, UA: NEGATIVE
Ketones, UA: NEGATIVE
Leukocytes, UA: NEGATIVE
Nitrite, UA: NEGATIVE
Protein, UA: NEGATIVE
Spec Grav, UA: 1.025
Urobilinogen, UA: 0.2
pH, UA: 6

## 2013-10-13 LAB — CBC WITH DIFFERENTIAL/PLATELET
Basophils Absolute: 0 10*3/uL (ref 0.0–0.1)
Basophils Relative: 0 % (ref 0–1)
Eosinophils Absolute: 0.3 10*3/uL (ref 0.0–0.7)
Eosinophils Relative: 3 % (ref 0–5)
HCT: 38.5 % (ref 36.0–46.0)
Hemoglobin: 13.2 g/dL (ref 12.0–15.0)
Lymphocytes Relative: 26 % (ref 12–46)
Lymphs Abs: 2.4 10*3/uL (ref 0.7–4.0)
MCH: 28.7 pg (ref 26.0–34.0)
MCHC: 34.3 g/dL (ref 30.0–36.0)
MCV: 83.7 fL (ref 78.0–100.0)
Monocytes Absolute: 0.4 10*3/uL (ref 0.1–1.0)
Monocytes Relative: 5 % (ref 3–12)
Neutro Abs: 6.2 10*3/uL (ref 1.7–7.7)
Neutrophils Relative %: 66 % (ref 43–77)
Platelets: 417 10*3/uL — ABNORMAL HIGH (ref 150–400)
RBC: 4.6 MIL/uL (ref 3.87–5.11)
RDW: 14.7 % (ref 11.5–15.5)
WBC: 9.3 10*3/uL (ref 4.0–10.5)

## 2013-10-13 LAB — LIPID PANEL
Cholesterol: 185 mg/dL (ref 0–200)
HDL: 46 mg/dL (ref >39)
LDL Cholesterol: 104 mg/dL — ABNORMAL HIGH (ref 0–99)
Total CHOL/HDL Ratio: 4 Ratio
Triglycerides: 175 mg/dL — ABNORMAL HIGH (ref <150)
VLDL: 35 mg/dL (ref 0–40)

## 2013-10-13 LAB — POCT GLYCOSYLATED HEMOGLOBIN (HGB A1C): Hemoglobin A1C: 6.6

## 2013-10-13 LAB — POCT UA - MICROSCOPIC ONLY
Bacteria, U Microscopic: NEGATIVE
Casts, Ur, LPF, POC: NEGATIVE
Crystals, Ur, HPF, POC: NEGATIVE
Yeast, UA: NEGATIVE

## 2013-10-13 LAB — GLUCOSE, POCT (MANUAL RESULT ENTRY): POC Glucose: 116 mg/dL — AB (ref 70–99)

## 2013-10-13 MED ORDER — LOSARTAN POTASSIUM 25 MG PO TABS: 25.0000 mg | ORAL_TABLET | Freq: Every day | ORAL | Status: DC

## 2013-10-13 MED ORDER — METFORMIN HCL ER 500 MG PO TB24: ORAL_TABLET | ORAL | Status: DC

## 2013-10-13 MED ORDER — GLIMEPIRIDE 2 MG PO TABS: 2.0000 mg | ORAL_TABLET | Freq: Every day | ORAL | Status: DC

## 2013-10-13 MED ORDER — METFORMIN HCL ER 500 MG PO TB24: 500.0000 mg | ORAL_TABLET | Freq: Every day | ORAL | Status: DC

## 2013-10-13 MED ORDER — METRONIDAZOLE 1 % EX GEL: Freq: Every day | CUTANEOUS | Status: DC

## 2013-10-13 NOTE — Progress Notes (Signed)
Patient ID: Regina Oneill MRN: [id number], DOB: 09/08/1946, 67 y.o. 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  Doing well. Enjoys spending time with granddaughter. Blood sugar today 138.  She has decreased the metformin to daily x 3 months on her own.  Has not done advance directives yet.  Pap:  January Colonoscopy: 10/01/2011 Mammogram:  07/17/2011 Bone density:07/17/2011  Review of Systems: Consitutional: No fever, chills, fatigue, night sweats, lymphadenopathy, or weight changes. Eyes: No visual changes, eye redness, or discharge. ENT/Mouth: Ears: No otalgia, tinnitus, hearing loss, discharge. Nose: No congestion, rhinorrhea, sinus pain, or epistaxis. Throat: No sore throat, post nasal drip, or teeth pain. Cardiovascular: No CP, palpitations, diaphoresis, DOE, edema, orthopnea, PND. Respiratory: No cough, hemoptysis, SOB, or wheezing. Gastrointestinal: No anorexia, dysphagia, reflux, pain, nausea, vomiting, hematemesis, diarrhea, constipation, BRBPR, or melena. Breast: No discharge, pain, swelling, or mass. Genitourinary: No dysuria, frequency, urgency, hematuria, incontinence, nocturia, amenorrhea, vaginal discharge, pruritis, burning, abnormal bleeding, or pain. Musculoskeletal: No decreased ROM, myalgias, stiffness, joint swelling, or weakness. Skin: No erythema, lesion changes, pain, warmth, jaundice, or pruritis.  She continues to have the 2.5 cm hyperpigmented annular area on her neck that seems to come and go.  She now notes hyperpigmented tip of nose. Neurological: No headache, dizziness, syncope, seizures, tremors, memory loss, coordination problems, or paresthesias. Psychological: No anxiety, depression, hallucinations, SI/HI. Endocrine: No fatigue, polydipsia, polyphagia, polyuria, or known diabetes. All other systems were  reviewed and are otherwise negative.  Past Medical History  Diagnosis Date  . Arthritis     knee - no meds  . Diabetes mellitus     recently dx in 12/12  . Hypertension     recently dx in 12/12     Past Surgical History  Procedure Laterality Date  . Knee arthroscopy  06/2000    right  . Multiple tooth extractions      all teeth extracted  . Svd      x 3  . Cholecystectomy    . Colonoscopy    . Dilation and curettage of uterus    . Hysteroscopy w/d&c  02/09/2012    Procedure: DILATATION AND CURETTAGE /HYSTEROSCOPY;  Surgeon: Robley Fries, MD;  Location: [facility];  Service: Gynecology;  Laterality: N/A;  true clear    Home Meds:  Prior to Admission medications   Medication Sig Start Date End Date Taking? Authorizing Provider  aspirin EC 81 MG tablet Take 81 mg by mouth daily.   Yes Historical Provider, MD  fluocinonide cream (LIDEX) 0.05 % Apply 1 application topically 2 (two) times daily as needed. For eczema 02/01/13  Yes Elvina Sidle, MD  glimepiride (AMARYL) 2 MG tablet Take 1 tablet (2 mg total) by mouth daily before breakfast. 02/01/13 02/01/14 Yes Elvina Sidle, MD  losartan (COZAAR) 25 MG tablet Take 1 tablet (25 mg total) by mouth daily. 02/18/13 02/18/14 Yes Eleanore Delia Chimes, PA-C  metFORMIN (GLUCOPHAGE-XR) 500 MG 24 hr tablet Take one tablet by mouth twice daily 02/01/13  Yes Elvina Sidle, MD    Allergies:  Allergies  Allergen Reactions  . Lisinopril Cough    History   Social History  . Marital Status: Widowed    Spouse Name: N/A    Number of Children: N/A  . Years of Education: N/A   Occupational History  . Not on file.   Social History  Main Topics  . Smoking status: Never Smoker   . Smokeless tobacco: Never Used  . Alcohol Use: No  . Drug Use: No  . Sexual Activity: No   Other Topics Concern  . Not on file   Social History Narrative  . No narrative on file    Family History  Problem Relation Age of Onset  . Alzheimer's disease Mother   .  Diabetes Sister   . Lupus Sister     Physical Exam: Blood pressure 119/69, pulse 62, temperature 97.9 F (36.6 C), resp. rate 16, height 5' 3.5" (1.613 m), weight 214 lb (97.07 kg)., Body mass index is 37.31 kg/(m^2). General: Well developed, well nourished, in no acute distress. HEENT: Normocephalic, atraumatic. Conjunctiva pink, sclera non-icteric. Pupils 2 mm constricting to 1 mm, round, regular, and equally reactive to light and accomodation. EOMI. Internal auditory canal clear. TMs with good cone of light and without pathology. Nasal mucosa pink. Nares are without discharge. No sinus tenderness. Oral mucosa pink. Dentition upper plate. Pharynx without exudate.   Neck: Supple. Trachea midline. No thyromegaly. Full ROM. No lymphadenopathy. Lungs: Clear to auscultation bilaterally without wheezes, rales, or rhonchi. Breathing is of normal effort and unlabored. Cardiovascular: RRR with S1 S2. No murmurs, rubs, or gallops appreciated. Distal pulses 2+ symmetrically. No carotid or abdominal bruits. Breast: Symmetrical. No masses. Nipples without discharge. Abdomen: Soft, non-tender, non-distended with normoactive bowel sounds. No hepatosplenomegaly or masses. No rebound/guarding. No CVA tenderness. Without hernias.  Musculoskeletal: Full range of motion and 5/5 strength throughout. Without swelling, atrophy, tenderness, crepitus, or warmth. Extremities without clubbing, cyanosis, or edema. Calves supple. Skin: Warm and moist without erythema, ecchymosis, wounds.  Patient has annular macular plaque on lower posterior c/spine and her nose is becoming hyperpigmented. Neuro: A+Ox3. CN II-XII grossly intact. Moves all extremities spontaneously. Full sensation throughout. Normal gait. DTR 2+ throughout upper and lower extremities. Finger to nose intact. Psych:  Responds to questions appropriately with a normal affect.  Normal depression scale Studies: CBC, CMET, Lipid, TSH Results for orders placed in  visit on 10/13/13  POCT URINALYSIS DIPSTICK      Result Value Range   Color, UA yellow     Clarity, UA clear     Glucose, UA neg     Bilirubin, UA neg     Ketones, UA neg     Spec Grav, UA 1.025     Blood, UA moderate     pH, UA 6.0     Protein, UA neg     Urobilinogen, UA 0.2     Nitrite, UA neg     Leukocytes, UA Negative    GLUCOSE, POCT (MANUAL RESULT ENTRY)      Result Value Range   POC Glucose 116 (*) 70 - 99 mg/dl     Assessment/Plan:  67 y.o. y/o female here for CPE Hypertension - a - Plan: losartan (COZAAR) 25 MG tablet  Routine general medical examination at a health care facility - Plan: POCT urinalysis dipstick, Comprehensive metabolic panel, Lipid panel, TSH, Microalbumin, urine, POCT glucose (manual entry), POCT glycosylated hemoglobin (Hb A1C), CBC with Differential, POCT UA - Microscopic Only, CANCELED: CBC with Differential, CANCELED: POCT CBC  Diabetes type 2, controlled - Plan: Comprehensive metabolic panel, Lipid panel, Microalbumin, urine, POCT glucose (manual entry), POCT glycosylated hemoglobin (Hb A1C), glimepiride (AMARYL) 2 MG tablet, metFORMIN (GLUCOPHAGE-XR) 500 MG 24 hr tablet, CANCELED: POCT CBC, DISCONTINUED: metFORMIN (GLUCOPHAGE-XR) 500 MG 24 hr tablet  Need for prophylactic vaccination and inoculation  against influenza - Plan: Flu Vaccine QUAD 36+ mos IM  Hematuria - Plan: Urine culture, POCT UA - Microscopic Only  Facial rash - Plan: metroNIDAZOLE (METROGEL) 1 % gel  Skin lesion of back  DM (diabetes mellitus) - Plan: glimepiride (AMARYL) 2 MG tablet  Hypertension - Plan: losartan (COZAAR) 25 MG tablet   - zostavax Rx written. Flu shot today Signed, Elvina Sidle, MD 10/13/2013 1:02 PM

## 2013-10-13 NOTE — Patient Instructions (Addendum)
Health Maintenance, Females A healthy lifestyle and preventative care can promote health and wellness.  Maintain regular health, dental, and eye exams.  Eat a healthy diet. Foods like vegetables, fruits, whole grains, low-fat dairy products, and lean protein foods contain the nutrients you need without too many calories. Decrease your intake of foods high in solid fats, added sugars, and salt. Get information about a proper diet from your caregiver, if necessary.  Regular physical exercise is one of the most important things you can do for your health. Most adults should get at least 150 minutes of moderate-intensity exercise (any activity that increases your heart rate and causes you to sweat) each week. In addition, most adults need muscle-strengthening exercises on 2 or more days a week.   Maintain a healthy weight. The body mass index (BMI) is a screening tool to identify possible weight problems. It provides an estimate of body fat based on height and weight. Your caregiver can help determine your BMI, and can help you achieve or maintain a healthy weight. For adults 20 years and older:  A BMI below 18.5 is considered underweight.  A BMI of 18.5 to 24.9 is normal.  A BMI of 25 to 29.9 is considered overweight.  A BMI of 30 and above is considered obese.  Maintain normal blood lipids and cholesterol by exercising and minimizing your intake of saturated fat. Eat a balanced diet with plenty of fruits and vegetables. Blood tests for lipids and cholesterol should begin at age 20 and be repeated every 5 years. If your lipid or cholesterol levels are high, you are over 50, or you are a high risk for heart disease, you may need your cholesterol levels checked more frequently.Ongoing high lipid and cholesterol levels should be treated with medicines if diet and exercise are not effective.  If you smoke, find out from your caregiver how to quit. If you do not use tobacco, do not start.  If you  are pregnant, do not drink alcohol. If you are breastfeeding, be very cautious about drinking alcohol. If you are not pregnant and choose to drink alcohol, do not exceed 1 drink per day. One drink is considered to be 12 ounces (355 mL) of beer, 5 ounces (148 mL) of wine, or 1.5 ounces (44 mL) of liquor.  Avoid use of street drugs. Do not share needles with anyone. Ask for help if you need support or instructions about stopping the use of drugs.  High blood pressure causes heart disease and increases the risk of stroke. Blood pressure should be checked at least every 1 to 2 years. Ongoing high blood pressure should be treated with medicines, if weight loss and exercise are not effective.  If you are 55 to 67 years old, ask your caregiver if you should take aspirin to prevent strokes.  Diabetes screening involves taking a blood sample to check your fasting blood sugar level. This should be done once every 3 years, after age 45, if you are within normal weight and without risk factors for diabetes. Testing should be considered at a younger age or be carried out more frequently if you are overweight and have at least 1 risk factor for diabetes.  Breast cancer screening is essential preventative care for women. You should practice "breast self-awareness." This means understanding the normal appearance and feel of your breasts and may include breast self-examination. Any changes detected, no matter how small, should be reported to a caregiver. Women in their 20s and 30s should have   a clinical breast exam (CBE) by a caregiver as part of a regular health exam every 1 to 3 years. After age 40, women should have a CBE every year. Starting at age 40, women should consider having a mammogram (breast X-ray) every year. Women who have a family history of breast cancer should talk to their caregiver about genetic screening. Women at a high risk of breast cancer should talk to their caregiver about having an MRI and a  mammogram every year.  The Pap test is a screening test for cervical cancer. Women should have a Pap test starting at age 21. Between ages 21 and 29, Pap tests should be repeated every 2 years. Beginning at age 30, you should have a Pap test every 3 years as long as the past 3 Pap tests have been normal. If you had a hysterectomy for a problem that was not cancer or a condition that could lead to cancer, then you no longer need Pap tests. If you are between ages 65 and 70, and you have had normal Pap tests going back 10 years, you no longer need Pap tests. If you have had past treatment for cervical cancer or a condition that could lead to cancer, you need Pap tests and screening for cancer for at least 20 years after your treatment. If Pap tests have been discontinued, risk factors (such as a new sexual partner) need to be reassessed to determine if screening should be resumed. Some women have medical problems that increase the chance of getting cervical cancer. In these cases, your caregiver may recommend more frequent screening and Pap tests.  The human papillomavirus (HPV) test is an additional test that may be used for cervical cancer screening. The HPV test looks for the virus that can cause the cell changes on the cervix. The cells collected during the Pap test can be tested for HPV. The HPV test could be used to screen women aged 30 years and older, and should be used in women of any age who have unclear Pap test results. After the age of 30, women should have HPV testing at the same frequency as a Pap test.  Colorectal cancer can be detected and often prevented. Most routine colorectal cancer screening begins at the age of 50 and continues through age 75. However, your caregiver may recommend screening at an earlier age if you have risk factors for colon cancer. On a yearly basis, your caregiver may provide home test kits to check for hidden blood in the stool. Use of a small camera at the end of a  tube, to directly examine the colon (sigmoidoscopy or colonoscopy), can detect the earliest forms of colorectal cancer. Talk to your caregiver about this at age 50, when routine screening begins. Direct examination of the colon should be repeated every 5 to 10 years through age 75, unless early forms of pre-cancerous polyps or small growths are found.  Hepatitis C blood testing is recommended for all people born from 1945 through 1965 and any individual with known risks for hepatitis C.  Practice safe sex. Use condoms and avoid high-risk sexual practices to reduce the spread of sexually transmitted infections (STIs). Sexually active women aged 25 and younger should be checked for Chlamydia, which is a common sexually transmitted infection. Older women with new or multiple partners should also be tested for Chlamydia. Testing for other STIs is recommended if you are sexually active and at increased risk.  Osteoporosis is a disease in which the   bones lose minerals and strength with aging. This can result in serious bone fractures. The risk of osteoporosis can be identified using a bone density scan. Women ages 10 and over and women at risk for fractures or osteoporosis should discuss screening with their caregivers. Ask your caregiver whether you should be taking a calcium supplement or vitamin D to reduce the rate of osteoporosis.  Menopause can be associated with physical symptoms and risks. Hormone replacement therapy is available to decrease symptoms and risks. You should talk to your caregiver about whether hormone replacement therapy is right for you.  Use sunscreen with a sun protection factor (SPF) of 30 or greater. Apply sunscreen liberally and repeatedly throughout the day. You should seek shade when your shadow is shorter than you. Protect yourself by wearing long sleeves, pants, a wide-brimmed hat, and sunglasses year round, whenever you are outdoors.  Notify your caregiver of new moles or  changes in moles, especially if there is a change in shape or color. Also notify your caregiver if a mole is larger than the size of a pencil eraser.  Stay current with your immunizations. Document Released: 06/30/2011 Document Revised: 03/08/2012 Document Reviewed: 06/30/2011 [facility] Patient Information 2014 [place], [place]. Shingles Shingles (herpes zoster) is an infection that is caused by the same virus that causes chickenpox (varicella). The infection causes a painful skin rash and fluid-filled blisters, which eventually break open, crust over, and heal. It may occur in any area of the body, but it usually affects only one side of the body or face. The pain of shingles usually lasts about 1 month. However, some people with shingles may develop long-term (chronic) pain in the affected area of the body. Shingles often occurs many years after the person had chickenpox. It is more common:  In people older than 50 years.  In people with weakened immune systems, such as those with HIV, AIDS, or cancer.  In people taking medicines that weaken the immune system, such as transplant medicines.  In people under great stress. CAUSES  Shingles is caused by the varicella zoster virus (VZV), which also causes chickenpox. After a person is infected with the virus, it can remain in the person's body for years in an inactive state (dormant). To cause shingles, the virus reactivates and breaks out as an infection in a nerve root. The virus can be spread from person to person (contagious) through contact with open blisters of the shingles rash. It will only spread to people who have not had chickenpox. When these people are exposed to the virus, they may develop chickenpox. They will not develop shingles. Once the blisters scab over, the person is no longer contagious and cannot spread the virus to others. SYMPTOMS  Shingles shows up in stages. The initial symptoms may be pain, itching, and tingling in an area  of the skin. This pain is usually described as burning, stabbing, or throbbing.In a few days or weeks, a painful red rash will appear in the area where the pain, itching, and tingling were felt. The rash is usually on one side of the body in a band or belt-like pattern. Then, the rash usually turns into fluid-filled blisters. They will scab over and dry up in approximately 2 3 weeks. Flu-like symptoms may also occur with the initial symptoms, the rash, or the blisters. These may include:  Fever.  Chills.  Headache.  Upset stomach. DIAGNOSIS  Your caregiver will perform a skin exam to diagnose shingles. Skin scrapings or fluid samples  may also be taken from the blisters. This sample will be examined under a microscope or sent to a lab for further testing. TREATMENT  There is no specific cure for shingles. Your caregiver will likely prescribe medicines to help you manage the pain, recover faster, and avoid long-term problems. This may include antiviral drugs, anti-inflammatory drugs, and pain medicines. HOME CARE INSTRUCTIONS   Take a cool bath or apply cool compresses to the area of the rash or blisters as directed. This may help with the pain and itching.   Only take over-the-counter or prescription medicines as directed by your caregiver.   Rest as directed by your caregiver.  Keep your rash and blisters clean with mild soap and cool water or as directed by your caregiver.  Do not pick your blisters or scratch your rash. Apply an anti-itch cream or numbing creams to the affected area as directed by your caregiver.  Keep your shingles rash covered with a loose bandage (dressing).  Avoid skin contact with:  Babies.   Pregnant women.   Children with eczema.   Elderly people with transplants.   People with chronic illnesses, such as leukemia or AIDS.   Wear loose-fitting clothing to help ease the pain of material rubbing against the rash.  Keep all follow-up  appointments with your caregiver.If the area involved is on your face, you may receive a referral for follow-up to a specialist, such as an eye doctor (ophthalmologist) or an ear, nose, and throat (ENT) doctor. Keeping all follow-up appointments will help you avoid eye complications, chronic pain, or disability.  SEEK IMMEDIATE MEDICAL CARE IF:   You have facial pain, pain around the eye area, or loss of feeling on one side of your face.  You have ear pain or ringing in your ear.  You have loss of taste.  Your pain is not relieved with prescribed medicines.   Your redness or swelling spreads.   You have more pain and swelling.  Your condition is worsening or has changed.   You have a feveror persistent symptoms for more than 2 3 days.  You have a fever and your symptoms suddenly get worse. MAKE SURE YOU:  Understand these instructions.  Will watch your condition.  Will get help right away if you are not doing well or get worse. Document Released: 12/15/2005 Document Revised: 09/08/2012 Document Reviewed: 07/29/2012 [facility] Patient Information 2014 [place], [place]. Advance Directives (My Voice, My Choice) Advance directives are a means for you to make choices about your health care. It is a way that you may accept or refuse medical treatment if you cannot speak for yourself. An advance directive gives you a way to express your wishes about treatment choices in the event that you cannot speak for yourself. These directives protect your right to make your own health care choices. Some examples of advance directives would be:  A living will is a prepared document that designates your wishes in the event of a serious illness when you cannot care for yourself.  A patient advocate designation for health care means you choose someone who knows your wishes and can speak for you, on your behalf, should you not be able to do so yourself. This is often a close friend or family  member.  Think about what is important for you in your life. To what extent do you want machines to keep you alive? How much pain are you willing to accept?  Decide what types of life-sustaining treatments you would  or would not want.  Name a person to be your advocate who understands all your wishes and is willing and able to carry them out.  A durable power of attorney for health care is a formal legal agreement with an attorney or legal representative who will be bound to carry out your wishes in the event you are unable to care for or represent yourself. This should be someone you trust to make important medical decisions for you.  Do Not Resuscitate (DNR) is a request to do nothing in the event that your heart stops. A DNR order is used if you are very ill and not expected to recover. DNR orders are accepted by nearly all caregivers and hospitals. Most caregiver's offices and hospitals have advance directive forms you can use. You may cancel or change these documents at any time. You must be mentally sound and able to communicate your wishes at the time you fill out these forms. Regardless of how you let your final wishes be known in the event of a terminal illness, make sure you discuss them with your family and friends. Copies should be given to your caregiver, your hospital, your advocate or attorney, and to significant others. If you travel, you may want to find out what is legal and binding in the states where you will be. Laws vary from state to state. Document Released: 02/23/2002 Document Revised: 03/08/2012 Document Reviewed: 08/22/2008 [facility] [place] [facility] Patient Information 2014 [place], [place].

## 2013-10-14 ENCOUNTER — Telehealth: Payer: Self-pay

## 2013-10-14 LAB — MICROALBUMIN, URINE: Microalb, Ur: 0.59 mg/dL (ref 0.00–1.89)

## 2013-10-14 LAB — URINE CULTURE
Colony Count: NO GROWTH
Organism ID, Bacteria: NO GROWTH

## 2013-10-14 LAB — TSH: TSH: 3.425 u[IU]/mL (ref 0.350–4.500)

## 2013-10-14 NOTE — Telephone Encounter (Signed)
Our bandaids are VERY sticky. She is advised to use soap and water to clean, not to re bandage. Advised her to get the other bandaid wet in the shower and then take it off.

## 2013-10-14 NOTE — Telephone Encounter (Signed)
Patient is diabetic.  When she removed the band aid from yesterdays OV - it took off part of her skin.   She wants to talk to someone with medical training.  She has a second band aid that needs to be taken off.  [phone]

## 2014-03-14 LAB — HM DIABETES EYE EXAM

## 2014-03-24 ENCOUNTER — Encounter: Payer: Self-pay | Admitting: *Deleted

## 2014-03-24 DIAGNOSIS — Z0100 Encounter for examination of eyes and vision without abnormal findings: Principal | ICD-10-CM

## 2014-03-24 DIAGNOSIS — E119 Type 2 diabetes mellitus without complications: Secondary | ICD-10-CM | POA: Insufficient documentation

## 2014-07-12 ENCOUNTER — Encounter: Payer: Self-pay | Admitting: Family Medicine

## 2014-08-03 ENCOUNTER — Other Ambulatory Visit: Payer: Self-pay | Admitting: Family Medicine

## 2014-08-10 ENCOUNTER — Telehealth: Payer: Self-pay | Admitting: *Deleted

## 2014-08-10 NOTE — Telephone Encounter (Signed)
Patient was called the morning to make an appointment to follow up on diabetes with Dr Milus GlazierLauenstein. She will make an appointment to have CPE in October this year and have DM checked.

## 2014-08-28 ENCOUNTER — Other Ambulatory Visit: Payer: Self-pay | Admitting: Family Medicine

## 2014-08-30 MED ORDER — GLIMEPIRIDE 2 MG PO TABS: 2.0000 mg | ORAL_TABLET | Freq: Every day | ORAL | Status: DC

## 2014-08-30 NOTE — Telephone Encounter (Signed)
Dr L, pt is overdue for f/up, but has appt sch for CPE 10/16/14. Do you want to OK a 90 day RF?

## 2014-08-30 NOTE — Telephone Encounter (Signed)
We haven't received a request for glimepiride , but I have pended it also. Dr Tally Due for 90 day RFs?

## 2014-08-30 NOTE — Telephone Encounter (Signed)
Pt called about this today.  She is completely out of medicine.  She needs both the glimepride & the losartan refilled.  Says pharmacy has been requesting these.  Call her at [phone]

## 2014-08-31 NOTE — Telephone Encounter (Signed)
Notified pt RFs sent. 

## 2014-10-16 ENCOUNTER — Encounter: Payer: Self-pay | Admitting: Family Medicine

## 2014-10-16 ENCOUNTER — Ambulatory Visit (INDEPENDENT_AMBULATORY_CARE_PROVIDER_SITE_OTHER): Payer: Medicare Other | Admitting: Family Medicine

## 2014-10-16 VITALS — BP 129/68 | HR 74 | Temp 98.1°F | Resp 16 | Ht 63.5 in | Wt 219.4 lb

## 2014-10-16 DIAGNOSIS — L309 Dermatitis, unspecified: Secondary | ICD-10-CM

## 2014-10-16 DIAGNOSIS — E119 Type 2 diabetes mellitus without complications: Secondary | ICD-10-CM

## 2014-10-16 DIAGNOSIS — Z23 Encounter for immunization: Secondary | ICD-10-CM

## 2014-10-16 DIAGNOSIS — Z0000 Encounter for general adult medical examination without abnormal findings: Secondary | ICD-10-CM

## 2014-10-16 DIAGNOSIS — L853 Xerosis cutis: Secondary | ICD-10-CM

## 2014-10-16 LAB — COMPREHENSIVE METABOLIC PANEL
ALT: 15 U/L (ref 0–35)
AST: 15 U/L (ref 0–37)
Albumin: 3.7 g/dL (ref 3.5–5.2)
Alkaline Phosphatase: 82 U/L (ref 39–117)
BUN: 14 mg/dL (ref 6–23)
CO2: 28 mEq/L (ref 19–32)
Calcium: 8.8 mg/dL (ref 8.4–10.5)
Chloride: 102 mEq/L (ref 96–112)
Creat: 0.91 mg/dL (ref 0.50–1.10)
Glucose, Bld: 162 mg/dL — ABNORMAL HIGH (ref 70–99)
Potassium: 4.2 mEq/L (ref 3.5–5.3)
Sodium: 137 mEq/L (ref 135–145)
Total Bilirubin: 0.4 mg/dL (ref 0.2–1.2)
Total Protein: 7 g/dL (ref 6.0–8.3)

## 2014-10-16 LAB — LIPID PANEL
Cholesterol: 152 mg/dL (ref 0–200)
HDL: 39 mg/dL — ABNORMAL LOW (ref >39)
LDL Cholesterol: 90 mg/dL (ref 0–99)
Total CHOL/HDL Ratio: 3.9 Ratio
Triglycerides: 113 mg/dL (ref <150)
VLDL: 23 mg/dL (ref 0–40)

## 2014-10-16 LAB — POCT URINALYSIS DIPSTICK
Bilirubin, UA: NEGATIVE
Glucose, UA: NEGATIVE
Ketones, UA: NEGATIVE
Nitrite, UA: NEGATIVE
Protein, UA: NEGATIVE
Spec Grav, UA: 1.02
Urobilinogen, UA: 0.2
pH, UA: 5.5

## 2014-10-16 LAB — CBC
HCT: 36.4 % (ref 36.0–46.0)
Hemoglobin: 11.6 g/dL — ABNORMAL LOW (ref 12.0–15.0)
MCH: 26.9 pg (ref 26.0–34.0)
MCHC: 31.9 g/dL (ref 30.0–36.0)
MCV: 84.3 fL (ref 78.0–100.0)
Platelets: 438 10*3/uL — ABNORMAL HIGH (ref 150–400)
RBC: 4.32 MIL/uL (ref 3.87–5.11)
RDW: 15.1 % (ref 11.5–15.5)
WBC: 10.2 10*3/uL (ref 4.0–10.5)

## 2014-10-16 LAB — POCT GLYCOSYLATED HEMOGLOBIN (HGB A1C): Hemoglobin A1C: 7.9

## 2014-10-16 MED ORDER — PNEUMOCOCCAL 13-VAL CONJ VACC IM SUSP: 0.5000 mL | INTRAMUSCULAR | Status: DC

## 2014-10-16 MED ORDER — ZOSTER VACCINE LIVE 19400 UNT/0.65ML ~~LOC~~ SOLR: 0.6500 mL | Freq: Once | SUBCUTANEOUS | Status: DC

## 2014-10-16 MED ORDER — LOSARTAN POTASSIUM 25 MG PO TABS: 25.0000 mg | ORAL_TABLET | Freq: Every day | ORAL | Status: DC

## 2014-10-16 MED ORDER — METFORMIN HCL ER 500 MG PO TB24: ORAL_TABLET | ORAL | Status: DC

## 2014-10-16 MED ORDER — GLIMEPIRIDE 2 MG PO TABS: 2.0000 mg | ORAL_TABLET | Freq: Every day | ORAL | Status: DC

## 2014-10-16 MED ORDER — INFLUENZA VAC SPLIT QUAD 0.5 ML IM SUSY: 0.5000 mL | PREFILLED_SYRINGE | INTRAMUSCULAR | Status: DC

## 2014-10-16 MED ORDER — TRIAMCINOLONE ACETONIDE 0.1 % EX CREA: 1.0000 "application " | TOPICAL_CREAM | Freq: Two times a day (BID) | CUTANEOUS | Status: DC

## 2014-10-16 MED ORDER — FLUOCINONIDE 0.05 % EX CREA: 1.0000 "application " | TOPICAL_CREAM | Freq: Two times a day (BID) | CUTANEOUS | Status: DC | PRN

## 2014-10-16 NOTE — Progress Notes (Signed)
Patient ID: Regina Oneill MRN: [id number], DOB: 12-29-1946, 68 y.o. Date of Encounter: 10/16/2014, 8:37 AM  Primary Physician: [name],Lydia Meng, MD  Chief Complaint: Physical (CPE)  HPI: 68 y.o. y/o female with history of noted below here for CPE.  Doing well.  Former [employer].  Travels some to visit relatives Notes dry skin on forearms, told by nurse practitioner of insurance company that it might be drug allergy. Still has coin lesion on back of neck which improves with Lidex   Pap:  Last year evaluated by Dr. Juliene PinaMody for abnormal vaginal bleeding.  No further bleeding after evaluation and treatment MMG:  2013, encouraged to get another Last Td: 2011 Review of Systems: Consitutional: No fever, chills, fatigue, night sweats, lymphadenopathy, or weight changes. Eyes: No visual changes, eye redness, or discharge. ENT/Mouth: Ears: No otalgia, tinnitus, hearing loss, discharge. Nose: No congestion, rhinorrhea, sinus pain, or epistaxis. Throat: No sore throat, post nasal drip, or teeth pain. Cardiovascular: No CP, palpitations, diaphoresis, DOE, edema, orthopnea, PND. Respiratory: No cough, hemoptysis, SOB, or wheezing. Gastrointestinal: No anorexia, dysphagia, reflux, pain, nausea, vomiting, hematemesis, diarrhea, constipation, BRBPR, or melena. Breast: No discharge, pain, swelling, or mass. Genitourinary: No dysuria, frequency, urgency, hematuria, incontinence, nocturia, amenorrhea, vaginal discharge, pruritis, burning, abnormal bleeding, or pain. Musculoskeletal: No decreased ROM, myalgias, stiffness, joint swelling, or weakness. Skin: No erythema, lesion changes, pain, warmth, jaundice, or pruritis. Neurological: No headache, dizziness, syncope, seizures, tremors, memory loss, coordination problems, or paresthesias. Psychological: No anxiety, depression, hallucinations, SI/HI. Endocrine: No fatigue, polydipsia, polyphagia, polyuria, or known diabetes. All other systems were reviewed  and are otherwise negative.  Past Medical History  Diagnosis Date  . Arthritis     knee - no meds  . Diabetes mellitus     recently dx in 12/12  . Hypertension     recently dx in 12/12     Past Surgical History  Procedure Laterality Date  . Knee arthroscopy  06/2000    right  . Multiple tooth extractions      all teeth extracted  . Svd      x 3  . Cholecystectomy    . Colonoscopy    . Dilation and curettage of uterus    . Hysteroscopy w/d&c  02/09/2012    Procedure: DILATATION AND CURETTAGE /HYSTEROSCOPY;  Surgeon: Robley FriesVaishali R Mody, MD;  Location: [facility];  Service: Gynecology;  Laterality: N/A;  true clear    Home Meds:  Prior to Admission medications   Medication Sig Start Date End Date Taking? Authorizing Provider  aspirin EC 81 MG tablet Take 81 mg by mouth daily.   Yes Historical Provider, MD  fluocinonide cream (LIDEX) 0.05 % Apply 1 application topically 2 (two) times daily as needed. For eczema 10/16/14  Yes Elvina SidleKurt Grae Leathers, MD  glimepiride (AMARYL) 2 MG tablet Take 1 tablet (2 mg total) by mouth daily. 10/16/14  Yes Elvina SidleKurt Elijan Googe, MD  losartan (COZAAR) 25 MG tablet Take 1 tablet (25 mg total) by mouth daily. 10/16/14  Yes Elvina SidleKurt Hye Trawick, MD  metFORMIN (GLUCOPHAGE-XR) 500 MG 24 hr tablet Take one tablet by mouth  daily 10/16/14  Yes Elvina SidleKurt Abrham Maslowski, MD  triamcinolone cream (KENALOG) 0.1 % Apply 1 application topically 2 (two) times daily. 10/16/14   Elvina SidleKurt Rimsha Trembley, MD    Allergies:  Allergies  Allergen Reactions  . Lisinopril Cough    History   Social History  . Marital Status: Widowed    Spouse Name: N/A    Number of Children: N/A  .  Years of Education: N/A   Occupational History  . Not on file.   Social History Main Topics  . Smoking status: Never Smoker   . Smokeless tobacco: Never Used  . Alcohol Use: No  . Drug Use: No  . Sexual Activity: No   Other Topics Concern  . Not on file   Social History Narrative  . No narrative on file     Family History  Problem Relation Age of Onset  . Alzheimer's disease Mother   . Diabetes Sister   . Lupus Sister     Physical Exam: Blood pressure 129/68, pulse 74, temperature 98.1 F (36.7 C), temperature source Oral, resp. rate 16, height 5' 3.5" (1.613 m), weight 219 lb 6.4 oz (99.519 kg), SpO2 97.00%., Body mass index is 38.25 kg/(m^2). Wt Readings from Last 3 Encounters:  10/16/14 219 lb 6.4 oz (99.519 kg)  10/13/13 214 lb (97.07 kg)  03/30/13 216 lb (97.977 kg)   BP Readings from Last 3 Encounters:  10/16/14 129/68  10/13/13 119/69  03/30/13 132/82   General: Well developed, well nourished, in no acute distress. HEENT: Normocephalic, atraumatic. Conjunctiva pink, sclera non-icteric. Pupils 2 mm constricting to 1 mm, round, regular, and equally reactive to light and accomodation. EOMI. Internal auditory canal clear. TMs with good cone of light and without pathology. Nasal mucosa pink. Nares are without discharge. No sinus tenderness. Oral mucosa pink. Dentition fair. Pharynx without exudate.    Neck: Supple. Trachea midline. No thyromegaly. Full ROM. No lymphadenopathy. Lungs: Clear to auscultation bilaterally without wheezes, rales, or rhonchi. Breathing is of normal effort and unlabored. Cardiovascular: RRR with S1 S2. No murmurs, rubs, or gallops appreciated. Distal pulses 2+ symmetrically. No carotid or abdominal bruits. Breast: Symmetrical. No masses. Nipples without discharge. Abdomen: Soft, non-tender, non-distended with normoactive bowel sounds. No hepatosplenomegaly or masses. No rebound/guarding. No CVA tenderness. Without hernias.  Musculoskeletal: Full range of motion and 5/5 strength throughout. Without swelling, atrophy, tenderness, crepitus, or warmth. Extremities without clubbing, cyanosis, or edema. Calves supple. Skin: Warm and moist without erythema, ecchymosis, wounds.  Very fine white papules on dorsal forearms.  Hyperpigmented thick well circumscribed 1  cm lesion on back of neck.  Monofilament:  Normal with no foot lesions Neuro: A+Ox3. CN II-XII grossly intact. Moves all extremities spontaneously. Full sensation throughout. Normal gait. DTR 2+ throughout upper and lower extremities. Finger to nose intact. Psych:  Responds to questions appropriately with a normal affect.   Lab Results  Component Value Date   CHOL 185 10/13/2013   HDL 46 10/13/2013   LDLCALC 104* 10/13/2013   TRIG 175* 10/13/2013   CHOLHDL 4.0 10/13/2013   Results for orders placed in visit on 03/24/14  HM DIABETES EYE EXAM      Result Value Ref Range   HM Diabetic Eye Exam No Retinopathy  No Retinopathy     Assessment/Plan:  68 y.o. y/o female here for CPE -Annual physical exam - Plan: pneumococcal 13-valent conjugate vaccine (PREVNAR 13) injection 0.5 mL, Influenza vac split quadrivalent PF (FLUARIX) injection 0.5 mL, CANCELED: Varicella-zoster vaccine subcutaneous  Type 2 diabetes mellitus without complication - Plan: HM Diabetes Foot Exam, glimepiride (AMARYL) 2 MG tablet, POCT glycosylated hemoglobin (Hb A1C), CBC, Comprehensive metabolic panel, Lipid panel, POCT urinalysis dipstick  Xerosis cutis - Plan: triamcinolone cream (KENALOG) 0.1 %  Diabetes type 2, controlled - Plan: metFORMIN (GLUCOPHAGE-XR) 500 MG 24 hr tablet, POCT glycosylated hemoglobin (Hb A1C), CBC, Comprehensive metabolic panel, Lipid panel, POCT urinalysis dipstick, CANCELED: Varicella-zoster  vaccine subcutaneous  Eczema - Plan: fluocinonide cream (LIDEX) 0.05 %  Need for shingles vaccine - Plan: zoster vaccine live, PF, (ZOSTAVAX) [id number] UNT/0.65ML injection    Signed, Elvina SidleKurt Kerriann Kamphuis, MD 10/16/2014 8:37 AM    Patient ID: Regina Oneill MRN: [id number], DOB: [birth year], 68 y.o. Date of Encounter: 10/16/2014, 8:41 AM  Primary Physician: [name],Quinne Pires, MD  Chief Complaint: Diabetes follow up  HPI: 68 y.o. year old female with history below presents for follow up of diabetes  mellitus. Doing well. No issues or complaints. Taking medications daily without adverse effects. No polydipsia, polyphagia, polyuria, or nocturia.  Blood sugars at home: occasionally, last was 147  Last A1C:  Lab Results  Component Value Date   HGBA1C 6.6 10/13/2013     Eye MD: this year Influenza vaccine: due Pneumococcal vaccine: due for Prevnar Last CPE: last year  Past Medical History  Diagnosis Date  . Arthritis     knee - no meds  . Diabetes mellitus     recently dx in 12/12  . Hypertension     recently dx in 12/12     Home Meds: Prior to Admission medications   Medication Sig Start Date End Date Taking? Authorizing Provider  aspirin EC 81 MG tablet Take 81 mg by mouth daily.   Yes Historical Provider, MD  fluocinonide cream (LIDEX) 0.05 % Apply 1 application topically 2 (two) times daily as needed. For eczema 10/16/14  Yes Elvina SidleKurt Korea Severs, MD  glimepiride (AMARYL) 2 MG tablet Take 1 tablet (2 mg total) by mouth daily. 10/16/14  Yes Elvina SidleKurt Muhammed Teutsch, MD  losartan (COZAAR) 25 MG tablet Take 1 tablet (25 mg total) by mouth daily. 10/16/14  Yes Elvina SidleKurt Rafaelita Foister, MD  metFORMIN (GLUCOPHAGE-XR) 500 MG 24 hr tablet Take one tablet by mouth  daily 10/16/14  Yes Elvina SidleKurt Angela Platner, MD  triamcinolone cream (KENALOG) 0.1 % Apply 1 application topically 2 (two) times daily. 10/16/14   Elvina SidleKurt Vonnie Ligman, MD    Allergies:  Allergies  Allergen Reactions  . Lisinopril Cough    History   Social History  . Marital Status: Widowed    Spouse Name: N/A    Number of Children: N/A  . Years of Education: N/A   Occupational History  . Not on file.   Social History Main Topics  . Smoking status: Never Smoker   . Smokeless tobacco: Never Used  . Alcohol Use: No  . Drug Use: No  . Sexual Activity: No   Other Topics Concern  . Not on file   Social History Narrative  . No narrative on file     Lab Results  Component Value Date   HGBA1C 6.6 10/13/2013    Review of  Systems: Constitutional: negative for chills, fever, night sweats, weight changes, or fatigue  HEENT: negative for vision changes, hearing loss, congestion, rhinorrhea, or epistaxis Cardiovascular: negative for chest pain, palpitations, diaphoresis, DOE, orthopnea, or edema Respiratory: negative for hemoptysis, wheezing, shortness of breath, dyspnea, or cough Abdominal: negative for abdominal pain, nausea, vomiting, diarrhea, or constipation Dermatological: negative for erythema, or wounds Neurologic: negative for headache, dizziness, or syncope Renal:  Negative for polyuria, polydipsia, or dysuria All other systems reviewed and are otherwise negative with the exception to those above and in the HPI.   Physical Exam: Blood pressure 129/68, pulse 74, temperature 98.1 F (36.7 C), temperature source Oral, resp. rate 16, height 5' 3.5" (1.613 m), weight 219 lb 6.4 oz (99.519 kg), SpO2 97.00%., Body mass index is 38.25 kg/(m^2). Wt  Readings from Last 3 Encounters:  10/16/14 219 lb 6.4 oz (99.519 kg)  10/13/13 214 lb (97.07 kg)  03/30/13 216 lb (97.977 kg)   BP Readings from Last 3 Encounters:  10/16/14 129/68  10/13/13 119/69  03/30/13 132/82   General: Well developed, well nourished, in no acute distress. Head: Normocephalic, atraumatic, eyes without discharge, sclera non-icteric, nares are without discharge. Bilateral auditory canals clear, TM's are without perforation, pearly grey and translucent with reflective cone of light bilaterally. Oral cavity moist, posterior pharynx without exudate, erythema, peritonsillar abscess, or post nasal drip.  Neck: Supple. No thyromegaly. Full ROM. No lymphadenopathy. Lungs: Clear bilaterally to auscultation without wheezes, rales, or rhonchi. Breathing is unlabored. Heart: RRR with S1 S2. No murmurs, rubs, or gallops appreciated. Abdomen: Soft, non-tender, non-distended with normoactive bowel sounds. No hepatosplenomegaly. No rebound/guarding. No  obvious abdominal masses. Msk:  Strength and tone normal for age. Extremities/Skin: Warm and dry. No clubbing or cyanosis. No edema. No rashes, wounds, or suspicious lesions. Monofilament exam normal.  Neuro: Alert and oriented X 3. Moves all extremities spontaneously. Gait is normal. CNII-XII grossly in tact. Psych:  Responds to questions appropriately with a normal affect.   Labs: Results for orders placed in visit on 03/24/14  HM DIABETES EYE EXAM      Result Value Ref Range   HM Diabetic Eye Exam No Retinopathy  No Retinopathy      ASSESSMENT AND PLAN:  68 y.o. year old female with diabetes x 2 years.  Seems well controlled Continue current meds Work on exercise  Signed, Elvina Sidle, MD 10/16/2014 8:41 AM

## 2014-10-16 NOTE — Patient Instructions (Addendum)
Health Maintenance Adopting a healthy lifestyle and getting preventive care can go a long way to promote health and wellness. Talk with your health care provider about what schedule of regular examinations is right for you. This is a good chance for you to check in with your provider about disease prevention and staying healthy. In between checkups, there are plenty of things you can do on your own. Experts have done a lot of research about which lifestyle changes and preventive measures are most likely to keep you healthy. Ask your health care provider for more information. WEIGHT AND DIET  Eat a healthy diet  Be sure to include plenty of vegetables, fruits, low-fat dairy products, and lean protein.  Do not eat a lot of foods high in solid fats, added sugars, or salt.  Get regular exercise. This is one of the most important things you can do for your health.  Most adults should exercise for at least 150 minutes each week. The exercise should increase your heart rate and make you sweat (moderate-intensity exercise).  Most adults should also do strengthening exercises at least twice a week. This is in addition to the moderate-intensity exercise.  Maintain a healthy weight  Body mass index (BMI) is a measurement that can be used to identify possible weight problems. It estimates body fat based on height and weight. Your health care provider can help determine your BMI and help you achieve or maintain a healthy weight.  For females 25 years of age and older:   A BMI below 18.5 is considered underweight.  A BMI of 18.5 to 24.9 is normal.  A BMI of 25 to 29.9 is considered overweight.  A BMI of 30 and above is considered obese.  Watch levels of cholesterol and blood lipids  You should start having your blood tested for lipids and cholesterol at 68 years of age, then have this test every 5 years.  You may need to have your cholesterol levels checked more often if:  Your lipid or  cholesterol levels are high.  You are older than 68 years of age.  You are at high risk for heart disease.  CANCER SCREENING   Lung Cancer  Lung cancer screening is recommended for adults 97-92 years old who are at high risk for lung cancer because of a history of smoking.  A yearly low-dose CT scan of the lungs is recommended for people who:  Currently smoke.  Have quit within the past 15 years.  Have at least a 30-pack-year history of smoking. A pack year is smoking an average of one pack of cigarettes a day for 1 year.  Yearly screening should continue until it has been 15 years since you quit.  Yearly screening should stop if you develop a health problem that would prevent you from having lung cancer treatment.  Breast Cancer  Practice breast self-awareness. This means understanding how your breasts normally appear and feel.  It also means doing regular breast self-exams. Let your health care provider know about any changes, no matter how small.  If you are in your 20s or 30s, you should have a clinical breast exam (CBE) by a health care provider every 1-3 years as part of a regular health exam.  If you are 76 or older, have a CBE every year. Also consider having a breast X-ray (mammogram) every year.  If you have a family history of breast cancer, talk to your health care provider about genetic screening.  If you are  at high risk for breast cancer, talk to your health care provider about having an MRI and a mammogram every year.  Breast cancer gene (BRCA) assessment is recommended for women who have family members with BRCA-related cancers. BRCA-related cancers include:  Breast.  Ovarian.  Tubal.  Peritoneal cancers.  Results of the assessment will determine the need for genetic counseling and BRCA1 and BRCA2 testing. Cervical Cancer Routine pelvic examinations to screen for cervical cancer are no longer recommended for nonpregnant women who are considered low  risk for cancer of the pelvic organs (ovaries, uterus, and vagina) and who do not have symptoms. A pelvic examination may be necessary if you have symptoms including those associated with pelvic infections. Ask your health care provider if a screening pelvic exam is right for you.   The Pap test is the screening test for cervical cancer for women who are considered at risk.  If you had a hysterectomy for a problem that was not cancer or a condition that could lead to cancer, then you no longer need Pap tests.  If you are older than 65 years, and you have had normal Pap tests for the past 10 years, you no longer need to have Pap tests.  If you have had past treatment for cervical cancer or a condition that could lead to cancer, you need Pap tests and screening for cancer for at least 20 years after your treatment.  If you no longer get a Pap test, assess your risk factors if they change (such as having a new sexual partner). This can affect whether you should start being screened again.  Some women have medical problems that increase their chance of getting cervical cancer. If this is the case for you, your health care provider may recommend more frequent screening and Pap tests.  The human papillomavirus (HPV) test is another test that may be used for cervical cancer screening. The HPV test looks for the virus that can cause cell changes in the cervix. The cells collected during the Pap test can be tested for HPV.  The HPV test can be used to screen women 30 years of age and older. Getting tested for HPV can extend the interval between normal Pap tests from three to five years.  An HPV test also should be used to screen women of any age who have unclear Pap test results.  After 68 years of age, women should have HPV testing as often as Pap tests.  Colorectal Cancer  This type of cancer can be detected and often prevented.  Routine colorectal cancer screening usually begins at 68 years of  age and continues through 68 years of age.  Your health care provider may recommend screening at an earlier age if you have risk factors for colon cancer.  Your health care provider may also recommend using home test kits to check for hidden blood in the stool.  A small camera at the end of a tube can be used to examine your colon directly (sigmoidoscopy or colonoscopy). This is done to check for the earliest forms of colorectal cancer.  Routine screening usually begins at age 50.  Direct examination of the colon should be repeated every 5-10 years through 68 years of age. However, you may need to be screened more often if early forms of precancerous polyps or small growths are found. Skin Cancer  Check your skin from head to toe regularly.  Tell your health care provider about any new moles or changes in   moles, especially if there is a change in a mole's shape or color.  Also tell your health care provider if you have a mole that is larger than the size of a pencil eraser.  Always use sunscreen. Apply sunscreen liberally and repeatedly throughout the day.  Protect yourself by wearing long sleeves, pants, a wide-brimmed hat, and sunglasses whenever you are outside. HEART DISEASE, DIABETES, AND HIGH BLOOD PRESSURE   Have your blood pressure checked at least every 1-2 years. High blood pressure causes heart disease and increases the risk of stroke.  If you are between 75 years and 42 years old, ask your health care provider if you should take aspirin to prevent strokes.  Have regular diabetes screenings. This involves taking a blood sample to check your fasting blood sugar level.  If you are at a normal weight and have a low risk for diabetes, have this test once every three years after 68 years of age.  If you are overweight and have a high risk for diabetes, consider being tested at a younger age or more often. PREVENTING INFECTION  Hepatitis B  If you have a higher risk for  hepatitis B, you should be screened for this virus. You are considered at high risk for hepatitis B if:  You were born in a country where hepatitis B is common. Ask your health care provider which countries are considered high risk.  Your parents were born in a high-risk country, and you have not been immunized against hepatitis B (hepatitis B vaccine).  You have HIV or AIDS.  You use needles to inject street drugs.  You live with someone who has hepatitis B.  You have had sex with someone who has hepatitis B.  You get hemodialysis treatment.  You take certain medicines for conditions, including cancer, organ transplantation, and autoimmune conditions. Hepatitis C  Blood testing is recommended for:  Everyone born from [birth year] through 1965.  Anyone with known risk factors for hepatitis C. Sexually transmitted infections (STIs)  You should be screened for sexually transmitted infections (STIs) including gonorrhea and chlamydia if:  You are sexually active and are younger than 68 years of age.  You are older than 68 years of age and your health care provider tells you that you are at risk for this type of infection.  Your sexual activity has changed since you were last screened and you are at an increased risk for chlamydia or gonorrhea. Ask your health care provider if you are at risk.  If you do not have HIV, but are at risk, it may be recommended that you take a prescription medicine daily to prevent HIV infection. This is called pre-exposure prophylaxis (PrEP). You are considered at risk if:  You are sexually active and do not regularly use condoms or know the HIV status of your partner(s).  You take drugs by injection.  You are sexually active with a partner who has HIV. Talk with your health care provider about whether you are at high risk of being infected with HIV. If you choose to begin PrEP, you should first be tested for HIV. You should then be tested every 3 months for  as long as you are taking PrEP.  PREGNANCY   If you are premenopausal and you may become pregnant, ask your health care provider about preconception counseling.  If you may become pregnant, take 400 to 800 micrograms (mcg) of folic acid every day.  If you want to prevent pregnancy, talk to your  health care provider about birth control (contraception). OSTEOPOROSIS AND MENOPAUSE   Osteoporosis is a disease in which the bones lose minerals and strength with aging. This can result in serious bone fractures. Your risk for osteoporosis can be identified using a bone density scan.  If you are 60 years of age or older, or if you are at risk for osteoporosis and fractures, ask your health care provider if you should be screened.  Ask your health care provider whether you should take a calcium or vitamin D supplement to lower your risk for osteoporosis.  Menopause may have certain physical symptoms and risks.  Hormone replacement therapy may reduce some of these symptoms and risks. Talk to your health care provider about whether hormone replacement therapy is right for you.  HOME CARE INSTRUCTIONS   Schedule regular health, dental, and eye exams.  Stay current with your immunizations.   Do not use any tobacco products including cigarettes, chewing tobacco, or electronic cigarettes.  If you are pregnant, do not drink alcohol.  If you are breastfeeding, limit how much and how often you drink alcohol.  Limit alcohol intake to no more than 1 drink per day for nonpregnant women. One drink equals 12 ounces of beer, 5 ounces of wine, or 1 ounces of hard liquor.  Do not use street drugs.  Do not share needles.  Ask your health care provider for help if you need support or information about quitting drugs.  Tell your health care provider if you often feel depressed.  Tell your health care provider if you have ever been abused or do not feel safe at home. Document Released: 06/30/2011  Document Revised: 05/01/2014 Document Reviewed: 11/16/2013 [facility] Patient Information 2015 [place], [place]. This information is not intended to replace advice given to you by your health care provider. Make sure you discuss any questions you have with your health care provider.  Pneumococcal Conjugate Vaccine: What You Need to Know Your doctor recommends that you, or your child, get a dose of PCV13 today. 1. Why get vaccinated? Pneumococcal conjugate vaccine (called PCV13 or Prevnar 13) is recommended to protect infants and toddlers, and some older children and adults with certain health conditions, from pneumococcal disease. Pneumococcal disease is caused by infection with Streptococcus pneumoniae bacteria. These bacteria can spread from person to person through close contact. Pneumococcal disease can lead to severe health problems, including pneumonia, blood infections, and meningitis. Meningitis is an infection of the covering of the brain. Pneumococcal meningitis is fairly rare (less than 1 case per 100,000 people each year), but it leads to other health problems, including deafness and brain damage. In children, it is fatal in about 1 case out of 10. Children younger than two are at higher risk for serious disease than older children. People with certain medical conditions, people over age 24, and cigarette smokers are also at higher risk. Before vaccine, pneumococcal infections caused many problems each year in the [place] in children younger than 5, including:  more than 700 cases of meningitis,  13,000 blood infections,  about 5 million ear infections, and  about 200 deaths. About 4,000 adults still die each year because of pneumococcal infections. Pneumococcal infections can be hard to treat because some strains are resistant to antibiotics. This makes prevention through vaccination even more important. 2. PCV13 vaccine There are more than 90 types of pneumococcal bacteria.  PCV13 protects against 13 of them. These 13 strains cause most severe infections in children and about half of  infections in adults.  PCV13 is routinely given to children at 2, 4, 6, and 74-9 months of age. Children in this age range are at greatest risk for serious diseases caused by pneumococcal infection. PCV13 vaccine may also be recommended for some older children or adults. Your doctor can give you details. A second type of pneumococcal vaccine, called PPSV23, may also be given to some children and adults, including anyone over age 77. There is a separate Vaccine Information Statement for this vaccine. 3. Precautions  Anyone who has ever had a life-threatening allergic reaction to a dose of this vaccine, to an earlier pneumococcal vaccine called PCV7 (or Prevnar), or to any vaccine containing diphtheria toxoid (for example, DTaP), should not get PCV13. Anyone with a severe allergy to any component of PCV13 should not get the vaccine. Tell your doctor if the person being vaccinated has any severe allergies. If the person scheduled for vaccination is sick, your doctor might decide to reschedule the shot on another day. Your doctor can give you more information about any of these precautions. 4. What are the risks of PCV13 vaccine?  With any medicine, including vaccines, there is a chance of side effects. These are usually mild and go away on their own, but serious reactions are also possible. Reported problems associated with PCV13 vary by dose and age, but generally:  About half of children became drowsy after the shot, had a temporary loss of appetite, or had redness or tenderness where the shot was given.  About 1 out of 3 had swelling where the shot was given.  About 1 out of 3 had a mild fever, and about 1 in 20 had a higher fever (over 102.40F).  Up to about 8 out of 10 became fussy or irritable. Adults receiving the vaccine have reported redness, pain, and swelling where the shot was  given. Mild fever, fatigue, headache, chills, or muscle pain have also been reported. Life-threatening allergic reactions from any vaccine are very rare. 5. What if there is a serious reaction? What should I look for?  Look for anything that concerns you, such as signs of a severe allergic reaction, very high fever, or behavior changes. Signs of a severe allergic reaction can include hives, swelling of the face and throat, difficulty breathing, a fast heartbeat, dizziness, and weakness. These would start a few minutes to a few hours after the vaccination. What should I do?  If you think it is a severe allergic reaction or other emergency that can't wait, call 9-1-1 or get the person to the nearest hospital. Otherwise, call your doctor.  Afterward, the reaction should be reported to the Vaccine Adverse Event Reporting System (VAERS). Your doctor might file this report, or you can do it yourself through the VAERS web site at www.vaers.SamedayNews.es, or by calling (470)813-2297. VAERS is only for reporting reactions. They do not give medical advice. 6. The National Vaccine Injury Compensation Program The [employer] Vaccine Injury Compensation Program (VICP) is a federal program that was created to compensate people who may have been injured by certain vaccines. Persons who believe they may have been injured by a vaccine can learn about the program and about filing a claim by calling 979-802-7646 or visiting the [place] website at GoldCloset.com.ee. 7. How can I learn more?  Ask your doctor.  Call your local or state health department.  Contact the Centers for Disease Control and Prevention (CDC):  Call 613-401-3214 (1-800-CDC-INFO) or  Visit CDC's website at http://hunter.com/ CDC PCV13  Vaccine VIS (Interim) (02/25/12) Document Released: 10/12/2006 Document Revised: 05/01/2014 Document Reviewed: 02/03/2014 [facility] Patient Information 2015 [place], [place]. This information is not  intended to replace advice given to you by your health care provider. Make sure you discuss any questions you have with your health care provider. Influenza Vaccine (Flu Vaccine, Inactivated or Recombinant) 2014-2015: What You Need to Know 1. Why get vaccinated? Influenza ("flu") is a contagious disease that spreads around the [place] every winter, usually between October and May. Flu is caused by influenza viruses, and is spread mainly by coughing, sneezing, and close contact. Anyone can get flu, but the risk of getting flu is highest among children. Symptoms come on suddenly and may last several days. They can include:  fever/chills  sore throat  muscle aches  fatigue  cough  headache  runny or stuffy nose Flu can make some people much sicker than others. These people include young children, people 74 and older, pregnant women, and people with certain health conditions-such as heart, lung or kidney disease, nervous system disorders, or a weakened immune system. Flu vaccination is especially important for these people, and anyone in close contact with them. Flu can also lead to pneumonia, and make existing medical conditions worse. It can cause diarrhea and seizures in children. Each year thousands of people in the [place] die from flu, and many more are hospitalized. Flu vaccine is the best protection against flu and its complications. Flu vaccine also helps prevent spreading flu from person to person. 2. Inactivated and recombinant flu vaccines You are getting an injectable flu vaccine, which is either an "inactivated" or "recombinant" vaccine. These vaccines do not contain any live influenza virus. They are given by injection with a needle, and often called the "flu shot."  A different live, attenuated (weakened) influenza vaccine is sprayed into the nostrils. This vaccine is described in a separate Vaccine Information Statement. Flu vaccination is recommended every year.  Some children 6 months through 20 years of age might need two doses during one year. Flu viruses are always changing. Each year's flu vaccine is made to protect against 3 or 4 viruses that are likely to cause disease that year. Flu vaccine cannot prevent all cases of flu, but it is the best defense against the disease.  It takes about 2 weeks for protection to develop after the vaccination, and protection lasts several months to a year. Some illnesses that are not caused by influenza virus are often mistaken for flu. Flu vaccine will not prevent these illnesses. It can only prevent influenza. Some inactivated flu vaccine contains a very small amount of a mercury-based preservative called thimerosal. Studies have shown that thimerosal in vaccines is not harmful, but flu vaccines that do not contain a preservative are available. 3. Some people should not get this vaccine Tell the person who gives you the vaccine:  If you have any severe, life-threatening allergies. If you ever had a life-threatening allergic reaction after a dose of flu vaccine, or have a severe allergy to any part of this vaccine, including (for example) an allergy to gelatin, antibiotics, or eggs, you may be advised not to get vaccinated. Most, but not all, types of flu vaccine contain a small amount of egg protein.  If you ever had Guillain-Barr Syndrome (a severe paralyzing illness, also called GBS). Some people with a history of GBS should not get this vaccine. This should be discussed with your doctor.  If you are not feeling well. It is  usually okay to get flu vaccine when you have a mild illness, but you might be advised to wait until you feel better. You should come back when you are better. 4. Risks of a vaccine reaction With a vaccine, like any medicine, there is a chance of side effects. These are usually mild and go away on their own. Problems that could happen after any vaccine:  Brief fainting spells can happen after  any medical procedure, including vaccination. Sitting or lying down for about 15 minutes can help prevent fainting, and injuries caused by a fall. Tell your doctor if you feel dizzy, or have vision changes or ringing in the ears.  Severe shoulder pain and reduced range of motion in the arm where a shot was given can happen, very rarely, after a vaccination.  Severe allergic reactions from a vaccine are very rare, estimated at less than 1 in a million doses. If one were to occur, it would usually be within a few minutes to a few hours after the vaccination. Mild problems following inactivated flu vaccine:  soreness, redness, or swelling where the shot was given  hoarseness  sore, red or itchy eyes  cough  fever  aches  headache  itching  fatigue If these problems occur, they usually begin soon after the shot and last 1 or 2 days. Moderate problems following inactivated flu vaccine:  Young children who get inactivated flu vaccine and pneumococcal vaccine (PCV13) at the same time may be at increased risk for seizures caused by fever. Ask your doctor for more information. Tell your doctor if a child who is getting flu vaccine has ever had a seizure. Inactivated flu vaccine does not contain live flu virus, so you cannot get the flu from this vaccine. As with any medicine, there is a very remote chance of a vaccine causing a serious injury or death. The safety of vaccines is always being monitored. For more information, visit: http://www.aguilar.org/ 5. What if there is a serious reaction? What should I look for?  Look for anything that concerns you, such as signs of a severe allergic reaction, very high fever, or behavior changes. Signs of a severe allergic reaction can include hives, swelling of the face and throat, difficulty breathing, a fast heartbeat, dizziness, and weakness. These would start a few minutes to a few hours after the vaccination. What should I do?  If you think  it is a severe allergic reaction or other emergency that can't wait, call 9-1-1 and get the person to the nearest hospital. Otherwise, call your doctor.  Afterward, the reaction should be reported to the Vaccine Adverse Event Reporting System (VAERS). Your doctor should file this report, or you can do it yourself through the VAERS web site at www.vaers.SamedayNews.es, or by calling [phone]. VAERS does not give medical advice. 6. The National Vaccine Injury Compensation Program The [employer] Vaccine Injury Compensation Program (VICP) is a federal program that was created to compensate people who may have been injured by certain vaccines. Persons who believe they may have been injured by a vaccine can learn about the program and about filing a claim by calling (808)233-9040 or visiting the [place] website at GoldCloset.com.ee. There is a time limit to file a claim for compensation. 7. How can I learn more?  Ask your health care provider.  Call your local or state health department.  Contact the Centers for Disease Control and Prevention (CDC):  Call 6018865096 (1-800-CDC-INFO) or  Visit CDC's website at https://gibson.com/ [facility] Vaccine  Information Statement (Interim) Inactivated Influenza Vaccine (08/16/2013) Document Released: 10/09/2006 Document Revised: 05/01/2014 Document Reviewed: 12/02/2013 [facility] Patient Information 2015 [place], [place]. This information is not intended to replace advice given to you by your health care provider. Make sure you discuss any questions you have with your health care provider.

## 2014-11-27 ENCOUNTER — Ambulatory Visit (INDEPENDENT_AMBULATORY_CARE_PROVIDER_SITE_OTHER): Payer: Medicare Other | Admitting: Family Medicine

## 2014-11-27 ENCOUNTER — Encounter: Payer: Self-pay | Admitting: Family Medicine

## 2014-11-27 VITALS — BP 125/60 | HR 60 | Temp 98.4°F | Resp 16 | Ht 63.5 in | Wt 214.0 lb

## 2014-11-27 DIAGNOSIS — E119 Type 2 diabetes mellitus without complications: Secondary | ICD-10-CM

## 2014-11-27 DIAGNOSIS — R319 Hematuria, unspecified: Secondary | ICD-10-CM

## 2014-11-27 LAB — POCT URINALYSIS DIPSTICK
Bilirubin, UA: NEGATIVE
Glucose, UA: NEGATIVE
Ketones, UA: NEGATIVE
Nitrite, UA: NEGATIVE
Protein, UA: NEGATIVE
Spec Grav, UA: 1.015
Urobilinogen, UA: 0.2
pH, UA: 5.5

## 2014-11-27 LAB — POCT UA - MICROSCOPIC ONLY
Casts, Ur, LPF, POC: NEGATIVE
Crystals, Ur, HPF, POC: NEGATIVE
Mucus, UA: POSITIVE
Yeast, UA: NEGATIVE

## 2014-11-27 LAB — MICROALBUMIN, URINE: Microalb, Ur: 0.9 mg/dL (ref <2.0)

## 2014-11-27 NOTE — Progress Notes (Signed)
Patient ID: Regina Oneill MRN: [id number], DOB: 01/26/1946, 68 y.o. Date of Encounter: 11/27/2014, 8:34 AM  Primary Physician: [name],Hazeline Charnley, MD  Chief Complaint: Diabetes follow up  HPI: 68 y.o. year old female with history below presents for follow up of diabetes mellitus type 2. Doing well. No issues or complaints. Taking medications daily without adverse effects. No polydipsia, polyphagia, polyuria, or nocturia. No chest pain, SOB, bowel incontinence or urinary incontinence.   Pt is not exercising regularly.  Last A1C: 7.9 in 10/16/2014  Eye MD: Annually with last [facility] exam in March [facility]. Influenza vaccine: UTD Pneumococcal vaccine: UTD Last CPE: 10/16/2014 Zostavax: Pt denies receiving the shingles vaccine, but notes she does have the prescription.  Retired Comptrollerlibrarian  Past Medical History  Diagnosis Date  . Arthritis     knee - no meds  . Diabetes mellitus     recently dx in 12/12  . Hypertension     recently dx in 12/12     Home Meds: Prior to Admission medications   Medication Sig Start Date End Date Taking? Authorizing Provider  aspirin EC 81 MG tablet Take 81 mg by mouth daily.   Yes Historical Provider, MD  fluocinonide cream (LIDEX) 0.05 % Apply 1 application topically 2 (two) times daily as needed. For eczema 10/16/14  Yes Elvina SidleKurt Morad Tal, MD  glimepiride (AMARYL) 2 MG tablet Take 1 tablet (2 mg total) by mouth daily. 10/16/14  Yes Elvina SidleKurt Tishina Lown, MD  losartan (COZAAR) 25 MG tablet Take 1 tablet (25 mg total) by mouth daily. 10/16/14  Yes Elvina SidleKurt Ameen Mostafa, MD  metFORMIN (GLUCOPHAGE-XR) 500 MG 24 hr tablet Take one tablet by mouth  daily 10/16/14  Yes Elvina SidleKurt Shakura Cowing, MD  triamcinolone cream (KENALOG) 0.1 % Apply 1 application topically 2 (two) times daily. 10/16/14  Yes Elvina SidleKurt Edelyn Heidel, MD  zoster vaccine live, PF, (ZOSTAVAX) [id number] UNT/0.65ML injection Inject 19,400 Units into the skin once. Patient not taking: Reported on 11/27/2014 10/16/14    Elvina SidleKurt Quentin Shorey, MD    Allergies:  Allergies  Allergen Reactions  . Lisinopril Cough    History   Social History  . Marital Status: Widowed    Spouse Name: N/A    Number of Children: N/A  . Years of Education: N/A   Occupational History  . Not on file.   Social History Main Topics  . Smoking status: Never Smoker   . Smokeless tobacco: Never Used  . Alcohol Use: No  . Drug Use: No  . Sexual Activity: No   Other Topics Concern  . Not on file   Social History Narrative     Lab Results  Component Value Date   HGBA1C 7.9 10/16/2014    Review of Systems: Constitutional: negative for chills, fever, night sweats, weight changes, or fatigue  HEENT: negative for vision changes, hearing loss, congestion, rhinorrhea, or epistaxis Cardiovascular: negative for chest pain, palpitations, diaphoresis, DOE, orthopnea, or edema Respiratory: negative for hemoptysis, wheezing, shortness of breath, dyspnea, or cough Abdominal: negative for abdominal pain, nausea, vomiting, diarrhea, or constipation Dermatological: negative for rash, erythema, or wounds Neurologic: negative for headache, dizziness, or syncope Renal:  Negative for polyuria, polydipsia, or dysuria All other systems reviewed and are otherwise negative with the exception to those above and in the HPI.   Physical Exam: Blood pressure 125/60, pulse 60, temperature 98.4 F (36.9 C), temperature source Oral, resp. rate 16, height 5' 3.5" (1.613 m), weight 214 lb (97.07 kg), SpO2 98 %., Body mass index is  37.31 kg/(m^2). Wt Readings from Last 3 Encounters:  11/27/14 214 lb (97.07 kg)  10/16/14 219 lb 6.4 oz (99.519 kg)  10/13/13 214 lb (97.07 kg)   BP Readings from Last 3 Encounters:  11/27/14 125/60  10/16/14 129/68  10/13/13 119/69   General: Well developed, well nourished, in no acute distress. Head: Normocephalic, atraumatic, eyes without discharge, sclera non-icteric, nares are without discharge. Bilateral  auditory canals clear, TM's are without perforation, pearly grey and translucent with reflective cone of light bilaterally. Oral cavity moist, posterior pharynx without exudate, erythema, peritonsillar abscess, or post nasal drip.  Neck: Supple. No thyromegaly. Full ROM. No lymphadenopathy.  Fundi normal. Lungs: Clear bilaterally to auscultation without wheezes, rales, or rhonchi. Breathing is unlabored. Heart: RRR with S1 S2. No murmurs, rubs, or gallops appreciated. Abdomen: Soft, non-tender, non-distended with normoactive bowel sounds. No hepatosplenomegaly. No rebound/guarding. No obvious abdominal masses. Msk:  Strength and tone normal for age. Extremities/Skin: Warm and dry. No clubbing or cyanosis. No edema. No rashes, wounds, or suspicious lesions. Monofilament exam good.  Neuro: Alert and oriented X 3. Moves all extremities spontaneously. Gait is normal. CNII-XII grossly in tact.  Psych:  Responds to questions appropriately with a normal affect.     ASSESSMENT AND PLAN:  68 y.o. year old female with Type 2 diabetes mellitus, controlled - Plan: Microalbumin, urine  Hematuria - Plan: POCT UA - Microscopic Only, POCT urinalysis dipstick  This chart was scribed in my presence and reviewed by me personally.   Signed, Elvina SidleKurt Takesha Steger, MD 11/27/2014 8:34 AM

## 2014-11-27 NOTE — Patient Instructions (Signed)
Results for orders placed or performed in visit on 10/16/14  CBC  Result Value Ref Range   WBC 10.2 4.0 - 10.5 K/uL   RBC 4.32 3.87 - 5.11 MIL/uL   Hemoglobin 11.6 (L) 12.0 - 15.0 g/dL   HCT 36.4 36.0 - 46.0 %   MCV 84.3 78.0 - 100.0 fL   MCH 26.9 26.0 - 34.0 pg   MCHC 31.9 30.0 - 36.0 g/dL   RDW 15.1 11.5 - 15.5 %   Platelets 438 (H) 150 - 400 K/uL  Comprehensive metabolic panel  Result Value Ref Range   Sodium 137 135 - 145 mEq/L   Potassium 4.2 3.5 - 5.3 mEq/L   Chloride 102 96 - 112 mEq/L   CO2 28 19 - 32 mEq/L   Glucose, Bld 162 (H) 70 - 99 mg/dL   BUN 14 6 - 23 mg/dL   Creat 0.91 0.50 - 1.10 mg/dL   Total Bilirubin 0.4 0.2 - 1.2 mg/dL   Alkaline Phosphatase 82 39 - 117 U/L   AST 15 0 - 37 U/L   ALT 15 0 - 35 U/L   Total Protein 7.0 6.0 - 8.3 g/dL   Albumin 3.7 3.5 - 5.2 g/dL   Calcium 8.8 8.4 - 10.5 mg/dL  Lipid panel  Result Value Ref Range   Cholesterol 152 0 - 200 mg/dL   Triglycerides 113 <150 mg/dL   HDL 39 (L) >39 mg/dL   Total CHOL/HDL Ratio 3.9 Ratio   VLDL 23 0 - 40 mg/dL   LDL Cholesterol 90 0 - 99 mg/dL  POCT glycosylated hemoglobin (Hb A1C)  Result Value Ref Range   Hemoglobin A1C 7.9   POCT urinalysis dipstick  Result Value Ref Range   Color, UA yellow    Clarity, UA clear    Glucose, UA neg    Bilirubin, UA neg    Ketones, UA neg    Spec Grav, UA 1.020    Blood, UA mod    pH, UA 5.5    Protein, UA neg    Urobilinogen, UA 0.2    Nitrite, UA neg    Leukocytes, UA Trace    Health Maintenance Adopting a healthy lifestyle and getting preventive care can go a long way to promote health and wellness. Talk with your health care provider about what schedule of regular examinations is right for you. This is a good chance for you to check in with your provider about disease prevention and staying healthy. In between checkups, there are plenty of things you can do on your own. Experts have done a lot of research about which lifestyle changes and  preventive measures are most likely to keep you healthy. Ask your health care provider for more information. WEIGHT AND DIET  Eat a healthy diet  Be sure to include plenty of vegetables, fruits, low-fat dairy products, and lean protein.  Do not eat a lot of foods high in solid fats, added sugars, or salt.  Get regular exercise. This is one of the most important things you can do for your health.  Most adults should exercise for at least 150 minutes each week. The exercise should increase your heart rate and make you sweat (moderate-intensity exercise).  Most adults should also do strengthening exercises at least twice a week. This is in addition to the moderate-intensity exercise.  Maintain a healthy weight  Body mass index (BMI) is a measurement that can be used to identify possible weight problems. It estimates body fat based  on height and weight. Your health care provider can help determine your BMI and help you achieve or maintain a healthy weight.  For females 53 years of age and older:   A BMI below 18.5 is considered underweight.  A BMI of 18.5 to 24.9 is normal.  A BMI of 25 to 29.9 is considered overweight.  A BMI of 30 and above is considered obese.  Watch levels of cholesterol and blood lipids  You should start having your blood tested for lipids and cholesterol at 67 years of age, then have this test every 5 years.  You may need to have your cholesterol levels checked more often if:  Your lipid or cholesterol levels are high.  You are older than 68 years of age.  You are at high risk for heart disease.  CANCER SCREENING   Lung Cancer  Lung cancer screening is recommended for adults 55-13 years old who are at high risk for lung cancer because of a history of smoking.  A yearly low-dose CT scan of the lungs is recommended for people who:  Currently smoke.  Have quit within the past 15 years.  Have at least a 30-pack-year history of smoking. A pack year  is smoking an average of one pack of cigarettes a day for 1 year.  Yearly screening should continue until it has been 15 years since you quit.  Yearly screening should stop if you develop a health problem that would prevent you from having lung cancer treatment.  Breast Cancer  Practice breast self-awareness. This means understanding how your breasts normally appear and feel.  It also means doing regular breast self-exams. Let your health care provider know about any changes, no matter how small.  If you are in your 20s or 30s, you should have a clinical breast exam (CBE) by a health care provider every 1-3 years as part of a regular health exam.  If you are 66 or older, have a CBE every year. Also consider having a breast X-ray (mammogram) every year.  If you have a family history of breast cancer, talk to your health care provider about genetic screening.  If you are at high risk for breast cancer, talk to your health care provider about having an MRI and a mammogram every year.  Breast cancer gene (BRCA) assessment is recommended for women who have family members with BRCA-related cancers. BRCA-related cancers include:  Breast.  Ovarian.  Tubal.  Peritoneal cancers.  Results of the assessment will determine the need for genetic counseling and BRCA1 and BRCA2 testing. Cervical Cancer Routine pelvic examinations to screen for cervical cancer are no longer recommended for nonpregnant women who are considered low risk for cancer of the pelvic organs (ovaries, uterus, and vagina) and who do not have symptoms. A pelvic examination may be necessary if you have symptoms including those associated with pelvic infections. Ask your health care provider if a screening pelvic exam is right for you.   The Pap test is the screening test for cervical cancer for women who are considered at risk.  If you had a hysterectomy for a problem that was not cancer or a condition that could lead to  cancer, then you no longer need Pap tests.  If you are older than 65 years, and you have had normal Pap tests for the past 10 years, you no longer need to have Pap tests.  If you have had past treatment for cervical cancer or a condition that could lead to  cancer, you need Pap tests and screening for cancer for at least 20 years after your treatment.  If you no longer get a Pap test, assess your risk factors if they change (such as having a new sexual partner). This can affect whether you should start being screened again.  Some women have medical problems that increase their chance of getting cervical cancer. If this is the case for you, your health care provider may recommend more frequent screening and Pap tests.  The human papillomavirus (HPV) test is another test that may be used for cervical cancer screening. The HPV test looks for the virus that can cause cell changes in the cervix. The cells collected during the Pap test can be tested for HPV.  The HPV test can be used to screen women 14 years of age and older. Getting tested for HPV can extend the interval between normal Pap tests from three to five years.  An HPV test also should be used to screen women of any age who have unclear Pap test results.  After 68 years of age, women should have HPV testing as often as Pap tests.  Colorectal Cancer  This type of cancer can be detected and often prevented.  Routine colorectal cancer screening usually begins at 68 years of age and continues through 68 years of age.  Your health care provider may recommend screening at an earlier age if you have risk factors for colon cancer.  Your health care provider may also recommend using home test kits to check for hidden blood in the stool.  A small camera at the end of a tube can be used to examine your colon directly (sigmoidoscopy or colonoscopy). This is done to check for the earliest forms of colorectal cancer.  Routine screening usually  begins at age 65.  Direct examination of the colon should be repeated every 5-10 years through 68 years of age. However, you may need to be screened more often if early forms of precancerous polyps or small growths are found. Skin Cancer  Check your skin from head to toe regularly.  Tell your health care provider about any new moles or changes in moles, especially if there is a change in a mole's shape or color.  Also tell your health care provider if you have a mole that is larger than the size of a pencil eraser.  Always use sunscreen. Apply sunscreen liberally and repeatedly throughout the day.  Protect yourself by wearing long sleeves, pants, a wide-brimmed hat, and sunglasses whenever you are outside. HEART DISEASE, DIABETES, AND HIGH BLOOD PRESSURE   Have your blood pressure checked at least every 1-2 years. High blood pressure causes heart disease and increases the risk of stroke.  If you are between 33 years and 18 years old, ask your health care provider if you should take aspirin to prevent strokes.  Have regular diabetes screenings. This involves taking a blood sample to check your fasting blood sugar level.  If you are at a normal weight and have a low risk for diabetes, have this test once every three years after 68 years of age.  If you are overweight and have a high risk for diabetes, consider being tested at a younger age or more often. PREVENTING INFECTION  Hepatitis B  If you have a higher risk for hepatitis B, you should be screened for this virus. You are considered at high risk for hepatitis B if:  You were born in a country where hepatitis B  is common. Ask your health care provider which countries are considered high risk.  Your parents were born in a high-risk country, and you have not been immunized against hepatitis B (hepatitis B vaccine).  You have HIV or AIDS.  You use needles to inject street drugs.  You live with someone who has hepatitis B.  You  have had sex with someone who has hepatitis B.  You get hemodialysis treatment.  You take certain medicines for conditions, including cancer, organ transplantation, and autoimmune conditions. Hepatitis C  Blood testing is recommended for:  Everyone born from [birth year] through 1965.  Anyone with known risk factors for hepatitis C. Sexually transmitted infections (STIs)  You should be screened for sexually transmitted infections (STIs) including gonorrhea and chlamydia if:  You are sexually active and are younger than 68 years of age.  You are older than 68 years of age and your health care provider tells you that you are at risk for this type of infection.  Your sexual activity has changed since you were last screened and you are at an increased risk for chlamydia or gonorrhea. Ask your health care provider if you are at risk.  If you do not have HIV, but are at risk, it may be recommended that you take a prescription medicine daily to prevent HIV infection. This is called pre-exposure prophylaxis (PrEP). You are considered at risk if:  You are sexually active and do not regularly use condoms or know the HIV status of your partner(s).  You take drugs by injection.  You are sexually active with a partner who has HIV. Talk with your health care provider about whether you are at high risk of being infected with HIV. If you choose to begin PrEP, you should first be tested for HIV. You should then be tested every 3 months for as long as you are taking PrEP.  PREGNANCY   If you are premenopausal and you may become pregnant, ask your health care provider about preconception counseling.  If you may become pregnant, take 400 to 800 micrograms (mcg) of folic acid every day.  If you want to prevent pregnancy, talk to your health care provider about birth control (contraception). OSTEOPOROSIS AND MENOPAUSE   Osteoporosis is a disease in which the bones lose minerals and strength with aging. This  can result in serious bone fractures. Your risk for osteoporosis can be identified using a bone density scan.  If you are 45 years of age or older, or if you are at risk for osteoporosis and fractures, ask your health care provider if you should be screened.  Ask your health care provider whether you should take a calcium or vitamin D supplement to lower your risk for osteoporosis.  Menopause may have certain physical symptoms and risks.  Hormone replacement therapy may reduce some of these symptoms and risks. Talk to your health care provider about whether hormone replacement therapy is right for you.  HOME CARE INSTRUCTIONS   Schedule regular health, dental, and eye exams.  Stay current with your immunizations.   Do not use any tobacco products including cigarettes, chewing tobacco, or electronic cigarettes.  If you are pregnant, do not drink alcohol.  If you are breastfeeding, limit how much and how often you drink alcohol.  Limit alcohol intake to no more than 1 drink per day for nonpregnant women. One drink equals 12 ounces of beer, 5 ounces of wine, or 1 ounces of hard liquor.  Do not use street drugs.  Do not share needles.  Ask your health care provider for help if you need support or information about quitting drugs.  Tell your health care provider if you often feel depressed.  Tell your health care provider if you have ever been abused or do not feel safe at home. Document Released: 06/30/2011 Document Revised: 05/01/2014 Document Reviewed: 11/16/2013 [facility] Patient Information 2015 [place], [place]. This information is not intended to replace advice given to you by your health care provider. Make sure you discuss any questions you have with your health care provider.

## 2014-11-28 ENCOUNTER — Encounter: Payer: Self-pay | Admitting: Family Medicine

## 2014-12-01 ENCOUNTER — Ambulatory Visit (INDEPENDENT_AMBULATORY_CARE_PROVIDER_SITE_OTHER): Payer: Medicare Other | Admitting: Family Medicine

## 2014-12-01 VITALS — BP 132/82 | HR 87 | Temp 100.0°F | Resp 17 | Ht 63.0 in | Wt 216.0 lb

## 2014-12-01 DIAGNOSIS — L03114 Cellulitis of left upper limb: Secondary | ICD-10-CM

## 2014-12-01 MED ORDER — AMOXICILLIN-POT CLAVULANATE 875-125 MG PO TABS: 1.0000 | ORAL_TABLET | Freq: Two times a day (BID) | ORAL | Status: DC

## 2014-12-01 NOTE — Progress Notes (Signed)
Patient ID: Regina Oneill, female   DOB: 01-31-1946, 68 y.o.   MRN: [id number]  This chart was scribed for Elvina SidleKurt Lauenstein, MD by Charline BillsEssence Howell, ED Scribe. The patient was seen in room 13. Patient's care was started at 3:11 PM.  Patient ID: Regina Oneill MRN: [id number], DOB: 01-31-1946, 68 y.o. Date of Encounter: 12/01/2014, 3:11 PM  Primary Physician: [name],[name], MD  Chief Complaint  Patient presents with   Allergic Reaction    patient states she had an allergic reaction to the shingles shot    Rash   HPI: 68 y.o. year old female with history below presents with an allergic reaction. Pt states [facility] [address] [facility] 2 days ago. She reports associated soreness, swelling, warmth and rash since receiving the injection. She also reports associated fever; triage temperature 100 F.   Past Medical History  Diagnosis Date   Arthritis     knee - no meds   Diabetes mellitus     recently dx in 12/12   Hypertension     recently dx in 12/12    Home Meds: Prior to Admission medications   Medication Sig Start Date End Date Taking? Authorizing Provider  aspirin EC 81 MG tablet Take 81 mg by mouth daily.   Yes Historical Provider, MD  fluocinonide cream (LIDEX) 0.05 % Apply 1 application topically 2 (two) times daily as needed. For eczema 10/16/14  Yes Elvina SidleKurt Lauenstein, MD  glimepiride (AMARYL) 2 MG tablet Take 1 tablet (2 mg total) by mouth daily. 10/16/14  Yes Elvina SidleKurt Lauenstein, MD  losartan (COZAAR) 25 MG tablet Take 1 tablet (25 mg total) by mouth daily. 10/16/14  Yes Elvina SidleKurt Lauenstein, MD  metFORMIN (GLUCOPHAGE-XR) 500 MG 24 hr tablet Take one tablet by mouth  daily 10/16/14  Yes Elvina SidleKurt Lauenstein, MD  triamcinolone cream (KENALOG) 0.1 % Apply 1 application topically 2 (two) times daily. 10/16/14  Yes Elvina SidleKurt Lauenstein, MD  zoster vaccine live, PF, (ZOSTAVAX) [id number] UNT/0.65ML injection Inject 19,400 Units into the skin  once. Patient not taking: Reported on 11/27/2014 10/16/14   Elvina SidleKurt Lauenstein, MD    Allergies:  Allergies  Allergen Reactions   Lisinopril Cough    History   Social History   Marital Status: Widowed    Spouse Name: N/A    Number of Children: N/A   Years of Education: N/A   Occupational History   Not on file.   Social History Main Topics   Smoking status: Never Smoker    Smokeless tobacco: Never Used   Alcohol Use: No   Drug Use: No   Sexual Activity: No   Other Topics Concern   Not on file   Social History Narrative     Review of Systems: Constitutional: negative for chills, night sweats, weight changes, or fatigue, +fever HEENT: negative for vision changes, hearing loss, congestion, rhinorrhea, ST, epistaxis, or sinus pressure Cardiovascular: negative for chest pain or palpitations Respiratory: negative for hemoptysis, wheezing, shortness of breath, or cough Abdominal: negative for abdominal pain, nausea, vomiting, diarrhea, or constipation Msk: +myalgias  Dermatological: +rash Neurologic: negative for headache, dizziness, or syncope All other systems reviewed and are otherwise negative with the exception to those above and in the HPI.  Physical Exam: Triage Vitals: Blood pressure 132/82, pulse 87, temperature 100 F (37.8 C), temperature source Oral, resp. rate 17, height 5\' 3"  (1.6 m), weight 216 lb (97.977 kg), SpO2 96 %., Body mass index is 38.27 kg/(m^2).  General: Well developed, well nourished, in no acute distress. Head: Normocephalic, atraumatic, eyes without discharge, sclera non-icteric, nares are without discharge. Bilateral auditory canals clear, TM's are without perforation, pearly grey and translucent with reflective cone of light bilaterally. Oral cavity moist, posterior pharynx without exudate, erythema, peritonsillar abscess, or post nasal drip.  Neck: Supple. No thyromegaly. Full ROM. No lymphadenopathy. Lungs: Clear bilaterally to  auscultation without wheezes, rales, or rhonchi. Breathing is unlabored. Heart: RRR with S1 S2. No murmurs, rubs, or gallops appreciated. Abdomen: Soft, non-tender, non-distended with normoactive bowel sounds. No hepatomegaly. No rebound/guarding. No obvious abdominal masses. Msk:  Strength and tone normal for age. Extremities/Skin: Warm and dry. No clubbing or cyanosis. No edema. No suspicious lesions. L tricep is warm to touch, red, indurated and very tender.  Neuro: Alert and oriented X 3. Moves all extremities spontaneously. Gait is normal. CNII-XII grossly in tact. Psych:  Responds to questions appropriately with a normal affect.    ASSESSMENT AND PLAN:  68 y.o. year old female with  The encounter diagnosis was Left arm cellulitis. augmentin 875 bid x 10 days I personally performed the services described in this documentation, which was scribed in my presence. The recorded information has been reviewed and is accurate.  Signed, Elvina SidleKurt Lauenstein, MD 12/01/2014 3:11 PM

## 2014-12-01 NOTE — Patient Instructions (Signed)

## 2015-01-18 ENCOUNTER — Encounter: Payer: Self-pay | Admitting: Family Medicine

## 2015-01-18 ENCOUNTER — Ambulatory Visit (INDEPENDENT_AMBULATORY_CARE_PROVIDER_SITE_OTHER): Payer: 59 | Admitting: Family Medicine

## 2015-01-18 ENCOUNTER — Ambulatory Visit: Payer: Medicare Other | Admitting: Family Medicine

## 2015-01-18 VITALS — BP 130/72 | HR 60 | Temp 98.1°F | Resp 16 | Ht 63.0 in | Wt 215.0 lb

## 2015-01-18 DIAGNOSIS — E119 Type 2 diabetes mellitus without complications: Secondary | ICD-10-CM

## 2015-01-18 DIAGNOSIS — E1310 Other specified diabetes mellitus with ketoacidosis without coma: Secondary | ICD-10-CM

## 2015-01-18 DIAGNOSIS — E1110 Type 2 diabetes mellitus with ketoacidosis without coma: Secondary | ICD-10-CM

## 2015-01-18 LAB — COMPREHENSIVE METABOLIC PANEL
ALT: 13 U/L (ref 0–35)
AST: 12 U/L (ref 0–37)
Albumin: 3.7 g/dL (ref 3.5–5.2)
Alkaline Phosphatase: 90 U/L (ref 39–117)
BUN: 12 mg/dL (ref 6–23)
CO2: 28 mEq/L (ref 19–32)
Calcium: 9 mg/dL (ref 8.4–10.5)
Chloride: 102 mEq/L (ref 96–112)
Creat: 0.86 mg/dL (ref 0.50–1.10)
Glucose, Bld: 163 mg/dL — ABNORMAL HIGH (ref 70–99)
Potassium: 3.9 mEq/L (ref 3.5–5.3)
Sodium: 136 mEq/L (ref 135–145)
Total Bilirubin: 0.6 mg/dL (ref 0.2–1.2)
Total Protein: 7.3 g/dL (ref 6.0–8.3)

## 2015-01-18 LAB — POCT GLYCOSYLATED HEMOGLOBIN (HGB A1C): Hemoglobin A1C: 8.3

## 2015-01-18 MED ORDER — GLIMEPIRIDE 2 MG PO TABS: 2.0000 mg | ORAL_TABLET | Freq: Every day | ORAL | Status: DC

## 2015-01-18 MED ORDER — METFORMIN HCL ER 500 MG PO TB24: ORAL_TABLET | ORAL | Status: DC

## 2015-01-18 MED ORDER — PNEUMOCOCCAL 13-VAL CONJ VACC IM SUSP: 0.5000 mL | INTRAMUSCULAR | Status: DC

## 2015-01-18 NOTE — Progress Notes (Signed)
69 yo woman with headache and scratchy throat over the weekend.  She completed the augmentin prescribed last month.  Currently no headache or sorethroazt  Not checking sugars regularly.  Last checked last week when it was 163.  No changes in diet.  No problems with metformin.  Feet were not checked today by nurse.   Last eye exam was last march [facility] Mammogram 10/2014 Urine microalbumin:  10/2014  Objective:  NAD  HEENT:  Normal fundi Chest:  Clear Heart:  Reg, no murmur or gallop   Results for orders placed or performed in visit on 01/18/15  POCT glycosylated hemoglobin (Hb A1C)  Result Value Ref Range   Hemoglobin A1C 8.3     Assessment:  Elevated A1C [facility] to [facility].    Plan:   This chart was scribed in my presence and reviewed by me personally.    ICD-9-CM ICD-10-CM   1. Diabetes type 2, controlled 250.00 E11.9 metFORMIN (GLUCOPHAGE-XR) 500 MG 24 hr tablet  2. DM (diabetes mellitus) type 2, uncontrolled, with ketoacidosis 250.12 E13.10 Comprehensive metabolic panel     POCT glycosylated hemoglobin (Hb A1C)  3. Type 2 diabetes mellitus without complication 250.00 E11.9 glimepiride (AMARYL) 2 MG tablet    Signed, Elvina SidleKurt Hajime Asfaw, MD

## 2015-01-18 NOTE — Patient Instructions (Signed)
Recheck in april

## 2015-02-17 ENCOUNTER — Ambulatory Visit (INDEPENDENT_AMBULATORY_CARE_PROVIDER_SITE_OTHER): Payer: 59 | Admitting: Internal Medicine

## 2015-02-17 VITALS — BP 130/62 | HR 79 | Temp 98.1°F | Resp 16 | Ht 62.75 in | Wt 214.0 lb

## 2015-02-17 DIAGNOSIS — R1032 Left lower quadrant pain: Secondary | ICD-10-CM

## 2015-02-17 DIAGNOSIS — K5732 Diverticulitis of large intestine without perforation or abscess without bleeding: Secondary | ICD-10-CM

## 2015-02-17 MED ORDER — CIPROFLOXACIN HCL 500 MG PO TABS: 500.0000 mg | ORAL_TABLET | Freq: Two times a day (BID) | ORAL | Status: DC

## 2015-02-17 MED ORDER — METRONIDAZOLE 500 MG PO TABS: 500.0000 mg | ORAL_TABLET | Freq: Two times a day (BID) | ORAL | Status: DC

## 2015-02-17 NOTE — Progress Notes (Signed)
   Subjective:    Patient ID: Regina Oneill, female    DOB: 1946/08/19, 69 y.o.   MRN: [id number]  HPI  Chief Complaint  Patient presents with  . Diverticulitis   complaining of 48 hours of left lower quadrant discomfort that is familiar to her from past episodes of diverticulitis No fever chills No dysuria frequency No nausea or vomiting or change in appetite No constipation or diarrhea and no blood in stools  Patient Active Problem List   Diagnosis Date Noted  . Hypertension 07/27/2012  . DM (diabetes mellitus) 03/26/2012  . Postmenopausal bleeding 02/09/2012   her problems have been stable recently Prior to Admission medications   Medication Sig Start Date End Date Taking? Authorizing Provider  aspirin EC 81 MG tablet Take 81 mg by mouth daily.   Yes Historical Provider, MD  fluocinonide cream (LIDEX) 0.05 % Apply 1 application topically 2 (two) times daily as needed. For eczema 10/16/14  Yes Elvina SidleKurt Lauenstein, MD  glimepiride (AMARYL) 2 MG tablet Take 1 tablet (2 mg total) by mouth daily. 01/18/15  Yes Elvina SidleKurt Lauenstein, MD  losartan (COZAAR) 25 MG tablet Take 1 tablet (25 mg total) by mouth daily. 10/16/14  Yes Elvina SidleKurt Lauenstein, MD  metFORMIN (GLUCOPHAGE-XR) 500 MG 24 hr tablet Take one tablet by mouth  daily 01/18/15  Yes Elvina SidleKurt Lauenstein, MD  triamcinolone cream (KENALOG) 0.1 % Apply 1 application topically 2 (two) times daily. 10/16/14  Yes Elvina SidleKurt Lauenstein, MD  zoster vaccine live, PF, (ZOSTAVAX) [id number] UNT/0.65ML injection Inject 19,400 Units into the skin once. 10/16/14  Yes Elvina SidleKurt Lauenstein, MD   No change in diabetes symptoms at home//last A1c 8.1 January   Review of Systems No dysuria frequency nocturia or urgency No fever chills or night sweats No chest pain or palpitations No cough No rashes    Objective:   Physical Exam BP 130/62 mmHg  Pulse 79  Temp(Src) 98.1 F (36.7 C) (Oral)  Resp 16  Ht 5' 2.75" (1.594 m)  Wt 214 lb (97.07 kg)  BMI 38.20 kg/m2  SpO2  97% No acute distress HEENT clear Heart regular Abdomen soft nondistended and obese Mildly tender in the left lower quadrant palpation with no rebound or percussion tenderness and no guarding No organomegaly No peripheral edema  Unable to produce a urine specimen     Assessment & Plan:  Left lower quadrant abdominal pain suspicious for early on set of diverticulitis  Meds ordered this encounter  Medications  . ciprofloxacin (CIPRO) 500 MG tablet    Sig: Take 1 tablet (500 mg total) by mouth 2 (two) times daily.    Dispense:  14 tablet    Refill:  0  . metroNIDAZOLE (FLAGYL) 500 MG tablet    Sig: Take 1 tablet (500 mg total) by mouth 2 (two) times daily.    Dispense:  14 tablet    Refill:  0   Soft diet Close follow-up-consider CT if not well by Monday or Tuesday

## 2015-04-03 ENCOUNTER — Encounter: Payer: Self-pay | Admitting: *Deleted

## 2015-07-31 ENCOUNTER — Telehealth: Payer: Self-pay

## 2015-07-31 NOTE — Telephone Encounter (Signed)
RN from [facility] called to report an elevated A1C of 8.4. She stated pt does not know who her new PCP will be. I advised RN that pt is overdue for DM re-check and needs to RTC. She asked me to call pt, which I did. LMOM for pt asking her to call appt center and they will be able to advise her which providers are taking new pts and set up appt for DM check-up. Scheduling, will you also f/up on making sure this pt gets an appt set up please?

## 2015-08-01 ENCOUNTER — Encounter: Payer: Self-pay | Admitting: *Deleted

## 2015-08-27 ENCOUNTER — Ambulatory Visit (INDEPENDENT_AMBULATORY_CARE_PROVIDER_SITE_OTHER): Payer: 59 | Admitting: Family Medicine

## 2015-08-27 ENCOUNTER — Encounter: Payer: Self-pay | Admitting: Family Medicine

## 2015-08-27 ENCOUNTER — Ambulatory Visit: Payer: Self-pay | Admitting: Family Medicine

## 2015-08-27 VITALS — BP 130/70 | HR 64 | Temp 98.0°F | Resp 16 | Wt 206.0 lb

## 2015-08-27 DIAGNOSIS — E669 Obesity, unspecified: Secondary | ICD-10-CM

## 2015-08-27 DIAGNOSIS — E119 Type 2 diabetes mellitus without complications: Secondary | ICD-10-CM | POA: Diagnosis not present

## 2015-08-27 DIAGNOSIS — I10 Essential (primary) hypertension: Secondary | ICD-10-CM

## 2015-08-27 LAB — POCT URINALYSIS DIPSTICK
Bilirubin, UA: NEGATIVE
GLUCOSE UA: NEGATIVE
Ketones, UA: NEGATIVE
NITRITE UA: NEGATIVE
PH UA: 6
PROTEIN UA: NEGATIVE
SPEC GRAV UA: 1.02
UROBILINOGEN UA: 0.2

## 2015-08-27 LAB — POCT UA - MICROSCOPIC ONLY
Casts, Ur, LPF, POC: NEGATIVE
Crystals, Ur, HPF, POC: NEGATIVE
Mucus, UA: NEGATIVE
YEAST UA: NEGATIVE

## 2015-08-27 LAB — POCT GLYCOSYLATED HEMOGLOBIN (HGB A1C): HEMOGLOBIN A1C: 8

## 2015-08-27 NOTE — Patient Instructions (Addendum)
To help lower your blood sugars, it is important to limit your sugars and simple starches (desserts, white rice/potatoes/bread/pasta). Increase your vegetable and lean protein intake and try to increase your daily activity/exercise.   Increase your metformin to 2 tablets daily and I will send in a new prescription for the higher dose 1000 mg

## 2015-08-27 NOTE — Progress Notes (Signed)
Subjective:    Patient ID: Regina Oneill, female    DOB: [birth year], 69 y.o.   MRN: [id number]  HPI This is a 80 female who presents today for follow up of diabetes.  She does not check her blood sugar at home very often, but checked it this morning and it was 142 fasting. She just checked it and it was 133 in the exam room. She has had a 8 pound weight loss in last 6 months that she attributes to being very busy. Has not been trying to lose weight. Rarely drinks soda or sweet tea. Does not exercise regularly outside of daily activities. Has chronic right knee pain which is worse with walking.   She stays busy with her 78 yo granddaughter who attends [employer] and plays volleyball, basketball and golf.   Review of Systems No chest pain or SOB, energy level normal, tolerating medications without side effects.     Objective:   Physical Exam Physical Exam  Constitutional: Oriented to person, place, and time. Appears well-developed and well-nourished.  HENT:  Head: Normocephalic and atraumatic.  Eyes: Conjunctivae are normal.  Neck: Normal range of motion. Neck supple.  Cardiovascular: Normal rate, regular rhythm and normal heart sounds.   Pulmonary/Chest: Effort normal and breath sounds normal.  Musculoskeletal: Normal range of motion. Trace pretibial edema.  Neurological: Alert and oriented to person, place, and time.  Skin: Skin is warm and dry.  Psychiatric: Normal mood and affect. Behavior is normal. Judgment and thought content normal.  Vitals reviewed.  BP 130/70 mmHg  Pulse 64  Temp(Src) 98 F (36.7 C) (Oral)  Resp 16  Wt 206 lb (93.441 kg) Wt Readings from Last 3 Encounters:  08/27/15 206 lb (93.441 kg)  02/17/15 214 lb (97.07 kg)  01/18/15 215 lb (97.523 kg)   Unable to get adequate amount of blood after two sticks for labs. She reports little fluid intake over last 2 days.  Results for orders placed or performed in visit on 08/27/15  POCT glycosylated hemoglobin  (Hb A1C)  Result Value Ref Range   Hemoglobin A1C 8.0   POCT urinalysis dipstick  Result Value Ref Range   Color, UA Amber    Clarity, UA Cloudy    Glucose, UA negative    Bilirubin, UA Negative    Ketones, UA Negative    Spec Grav, UA 1.020    Blood, UA Moderate    pH, UA 6.0    Protein, UA negative    Urobilinogen, UA 0.2    Nitrite, UA Negative    Leukocytes, UA small (1+) (A) Negative  POCT UA - Microscopic Only  Result Value Ref Range   WBC, Ur, HPF, POC 1-3    RBC, urine, microscopic 3-7    Bacteria, U Microscopic Trace    Mucus, UA Negative    Epithelial cells, urine per micros 0-3    Crystals, Ur, HPF, POC Negative    Casts, Ur, LPF, POC Negative    Yeast, UA Negative       Assessment & Plan:  1. Type 2 diabetes mellitus without complication - POCT glycosylated hemoglobin (Hb A1C)- 8.0 down from reported 8.4  - POCT urinalysis dipstick - POCT UA - Microscopic Only - CBC; Future - Comprehensive metabolic panel; Future - metFORMIN (FORTAMET) 1000 MG (OSM) 24 hr tablet; Take one tablet by mouth  daily  Dispense: 30 tablet; Refill: 2 - discussed slightly improved HgbA1C results with patient and need for improved control with increasing  glucophage and improved diet and exercise.   2. Essential hypertension - POCT UA - Microscopic Only - CBC; Future - Comprehensive metabolic panel; Future  3. Obesity - CBC; Future - Comprehensive metabolic panel; Future - Lipid panel; Future - TSH; Future  - We were unable to get enough blood to do labs. Patient will RTC within 2 weeks for labs.  - Follow up in 3 months Olean Ree, FNP-BC  [facility]  08/28/2015 8:31 PM

## 2015-08-28 MED ORDER — METFORMIN HCL ER (OSM) 1000 MG PO TB24: ORAL_TABLET | ORAL | Status: DC

## 2015-08-30 ENCOUNTER — Encounter: Payer: Self-pay | Admitting: Family Medicine

## 2015-09-24 ENCOUNTER — Other Ambulatory Visit (INDEPENDENT_AMBULATORY_CARE_PROVIDER_SITE_OTHER): Payer: Medicare Other

## 2015-09-24 DIAGNOSIS — E119 Type 2 diabetes mellitus without complications: Secondary | ICD-10-CM

## 2015-09-24 DIAGNOSIS — I10 Essential (primary) hypertension: Secondary | ICD-10-CM

## 2015-09-24 DIAGNOSIS — E669 Obesity, unspecified: Secondary | ICD-10-CM

## 2015-09-24 LAB — LIPID PANEL
CHOL/HDL RATIO: 4.2 ratio (ref <=5.0)
CHOLESTEROL: 161 mg/dL (ref 125–200)
HDL: 38 mg/dL — AB (ref >=46)
LDL Cholesterol: 95 mg/dL (ref <130)
TRIGLYCERIDES: 139 mg/dL (ref <150)
VLDL: 28 mg/dL (ref <30)

## 2015-09-24 LAB — CBC
HCT: 36.9 % (ref 36.0–46.0)
HEMOGLOBIN: 12.2 g/dL (ref 12.0–15.0)
MCH: 27.7 pg (ref 26.0–34.0)
MCHC: 33.1 g/dL (ref 30.0–36.0)
MCV: 83.9 fL (ref 78.0–100.0)
MPV: 9.2 fL (ref 8.6–12.4)
PLATELETS: 432 10*3/uL — AB (ref 150–400)
RBC: 4.4 MIL/uL (ref 3.87–5.11)
RDW: 14.2 % (ref 11.5–15.5)
WBC: 9.5 10*3/uL (ref 4.0–10.5)

## 2015-09-24 LAB — COMPREHENSIVE METABOLIC PANEL
ALBUMIN: 3.7 g/dL (ref 3.6–5.1)
ALT: 13 U/L (ref 6–29)
AST: 13 U/L (ref 10–35)
Alkaline Phosphatase: 79 U/L (ref 33–130)
BUN: 8 mg/dL (ref 7–25)
CALCIUM: 8.7 mg/dL (ref 8.6–10.4)
CHLORIDE: 100 mmol/L (ref 98–110)
CO2: 28 mmol/L (ref 20–31)
Creat: 0.81 mg/dL (ref 0.50–0.99)
Glucose, Bld: 154 mg/dL — ABNORMAL HIGH (ref 65–99)
POTASSIUM: 4.2 mmol/L (ref 3.5–5.3)
Sodium: 136 mmol/L (ref 135–146)
TOTAL PROTEIN: 6.8 g/dL (ref 6.1–8.1)
Total Bilirubin: 0.7 mg/dL (ref 0.2–1.2)

## 2015-09-24 LAB — TSH: TSH: 3.648 u[IU]/mL (ref 0.350–4.500)

## 2015-09-24 NOTE — Progress Notes (Signed)
Pt is here for lab work only. 

## 2015-09-26 ENCOUNTER — Encounter: Payer: Self-pay | Admitting: Family Medicine

## 2015-11-15 ENCOUNTER — Ambulatory Visit (INDEPENDENT_AMBULATORY_CARE_PROVIDER_SITE_OTHER): Payer: Medicare Other | Admitting: Family Medicine

## 2015-11-15 VITALS — BP 122/74 | HR 77 | Temp 98.8°F | Resp 18 | Wt 210.0 lb

## 2015-11-15 DIAGNOSIS — R059 Cough, unspecified: Secondary | ICD-10-CM

## 2015-11-15 DIAGNOSIS — J069 Acute upper respiratory infection, unspecified: Secondary | ICD-10-CM

## 2015-11-15 DIAGNOSIS — R05 Cough: Secondary | ICD-10-CM | POA: Diagnosis not present

## 2015-11-15 MED ORDER — HYDROCODONE-HOMATROPINE 5-1.5 MG/5ML PO SYRP: ORAL_SOLUTION | ORAL | Status: DC

## 2015-11-15 NOTE — Progress Notes (Signed)
Subjective:  This chart was scribed for Meredith Staggers, MD by Broadus John, Medical Scribe. This patient was seen in Room 8 and the patient's care was started at 9:40 AM.   Patient ID: Regina Oneill, female    DOB: [birth year], 69 y.o.   MRN: [id number]  Chief Complaint  Patient presents with  . Cough    started Tuesday, productive cough unsure of the color  . Scratchy Throat    started Tuesday    HPI HPI Comments: Regina Oneill is a 69 y.o. female who presents to [facility] complaining of productive cough with associated sleep disturbance, onset 2 days ago.  Pt reports associated symptoms of scratchy throat, chest congestion, rhinorrhea. She took tussin DM for the symptoms. Pt denies subjective fever, shortness of breath or sick contact.   Pt indicates that she had diverticulitis last week with a resolved abdominal pain 5 days ago. Pt denies current abdominal pain. She is requesting a prescription for it.   Patient Active Problem List   Diagnosis Date Noted  . Hypertension 07/27/2012  . DM (diabetes mellitus) (HCC) 03/26/2012  . Postmenopausal bleeding 02/09/2012   Past Medical History  Diagnosis Date  . Arthritis     knee - no meds  . Diabetes mellitus     recently dx in 12/12  . Hypertension     recently dx in 12/12   Past Surgical History  Procedure Laterality Date  . Knee arthroscopy  06/2000    right  . Multiple tooth extractions      all teeth extracted  . Svd      x 3  . Cholecystectomy    . Colonoscopy    . Dilation and curettage of uterus    . Hysteroscopy w/d&c  02/09/2012    Procedure: DILATATION AND CURETTAGE /HYSTEROSCOPY;  Surgeon: Robley Fries, MD;  Location: [facility];  Service: Gynecology;  Laterality: N/A;  true clear   Allergies  Allergen Reactions  . Lisinopril Cough   Prior to Admission medications   Medication Sig Start Date End Date Taking? Authorizing Provider  aspirin EC 81 MG tablet Take 81 mg by mouth  daily.   Yes Historical Provider, MD  fluocinonide cream (LIDEX) 0.05 % Apply 1 application topically 2 (two) times daily as needed. For eczema 10/16/14  Yes Elvina Sidle, MD  glimepiride (AMARYL) 2 MG tablet Take 1 tablet (2 mg total) by mouth daily. 01/18/15  Yes Elvina Sidle, MD  losartan (COZAAR) 25 MG tablet Take 1 tablet (25 mg total) by mouth daily. 10/16/14  Yes Elvina Sidle, MD  metFORMIN (FORTAMET) 1000 MG (OSM) 24 hr tablet Take one tablet by mouth  daily 08/28/15  Yes Emi Belfast, FNP  triamcinolone cream (KENALOG) 0.1 % Apply 1 application topically 2 (two) times daily. Patient not taking: Reported on 11/15/2015 10/16/14   Elvina Sidle, MD   Social History   Social History  . Marital Status: Widowed    Spouse Name: N/A  . Number of Children: N/A  . Years of Education: N/A   Occupational History  . Not on file.   Social History Main Topics  . Smoking status: Never Smoker   . Smokeless tobacco: Never Used  . Alcohol Use: No  . Drug Use: No  . Sexual Activity: No   Other Topics Concern  . Not on file   Social History Narrative    Review of Systems  Constitutional: Negative for fever.  HENT:  Positive for congestion, rhinorrhea and sore throat.   Respiratory: Positive for cough. Negative for shortness of breath.   Psychiatric/Behavioral: Positive for sleep disturbance.       Objective:   Physical Exam  Constitutional: She is oriented to person, place, and time. She appears well-developed and well-nourished. No distress.  HENT:  Head: Normocephalic and atraumatic.  Right Ear: Hearing, tympanic membrane, external ear and ear canal normal.  Left Ear: Hearing, tympanic membrane, external ear and ear canal normal.  Nose: Nose normal.  Mouth/Throat: Oropharynx is clear and moist. No oropharyngeal exudate.  Minimal cerumen in the canals, but visualized TMs are pearly grey.  No sinus tenderness.  Eyes: Conjunctivae and EOM are normal. Pupils are  equal, round, and reactive to light.  Neck: Neck supple.  No lymphadenopathy in the neck.   Cardiovascular: Normal rate, regular rhythm, normal heart sounds and intact distal pulses.   No murmur heard. Pulmonary/Chest: Effort normal and breath sounds normal. No respiratory distress. She has no wheezes. She has no rhonchi.  Abdominal: Soft. She exhibits no distension. There is no tenderness.  Neurological: She is alert and oriented to person, place, and time. No cranial nerve deficit.  Skin: Skin is warm and dry. No rash noted.  Psychiatric: She has a normal mood and affect. Her behavior is normal.  Nursing note and vitals reviewed.   Filed Vitals:   11/15/15 0928  BP: 122/74  Pulse: 77  Temp: 98.8 F (37.1 C)  TempSrc: Oral  Resp: 18  Weight: 210 lb (95.255 kg)  SpO2: 96%       Assessment & Plan:   Regina Oneill is a 69 y.o. female Cough - Plan: HYDROcodone-homatropine (HYCODAN) 5-1.5 MG/5ML syrup  Acute upper respiratory infection - Plan: HYDROcodone-homatropine (HYCODAN) 5-1.5 MG/5ML syrup  Suspected viral upper respiratory infection with secondary cough. Lungs were clear. Reassuring vital signs and O2sat. Symptomatic care discussed with over-the-counter Mucinex or Mucinex DM, or she can continue her Tussin DM. For nighttime cough, prescribed Hycodan cough syrup. Side effects discussed and risk of sedation and dizziness discussed. RTC precautions.  Meds ordered this encounter  Medications  . HYDROcodone-homatropine (HYCODAN) 5-1.5 MG/5ML syrup    Sig: 46m by mouth a bedtime as needed for cough.    Dispense:  120 mL    Refill:  0   Patient Instructions  Over the counter mucinex or mucinex DM during the day, hydrocodone cough syrup at night only if needed. Drink plenty of fluids.  Return to the clinic or go to the nearest emergency room if any of your symptoms worsen or new symptoms occur.  Upper Respiratory Infection, Adult Most upper respiratory infections (URIs) are  a viral infection of the air passages leading to the lungs. A URI affects the nose, throat, and upper air passages. The most common type of URI is nasopharyngitis and is typically referred to as "the common cold." URIs run their course and usually go away on their own. Most of the time, a URI does not require medical attention, but sometimes a bacterial infection in the upper airways can follow a viral infection. This is called a secondary infection. Sinus and middle ear infections are common types of secondary upper respiratory infections. Bacterial pneumonia can also complicate a URI. A URI can worsen asthma and chronic obstructive pulmonary disease (COPD). Sometimes, these complications can require emergency medical care and may be life threatening.  CAUSES Almost all URIs are caused by viruses. A virus is a type of germ  and can spread from one person to another.  RISKS FACTORS You may be at risk for a URI if:   You smoke.   You have chronic heart or lung disease.  You have a weakened defense (immune) system.   You are very young or very old.   You have nasal allergies or asthma.  You work in crowded or poorly ventilated areas.  You work in health care facilities or schools. SIGNS AND SYMPTOMS  Symptoms typically develop 2-3 days after you come in contact with a cold virus. Most viral URIs last 7-10 days. However, viral URIs from the influenza virus (flu virus) can last 14-18 days and are typically more severe. Symptoms may include:   Runny or stuffy (congested) nose.   Sneezing.   Cough.   Sore throat.   Headache.   Fatigue.   Fever.   Loss of appetite.   Pain in your forehead, behind your eyes, and over your cheekbones (sinus pain).  Muscle aches.  DIAGNOSIS  Your health care provider may diagnose a URI by:  Physical exam.  Tests to check that your symptoms are not due to another condition such as:  Strep  throat.  Sinusitis.  Pneumonia.  Asthma. TREATMENT  A URI goes away on its own with time. It cannot be cured with medicines, but medicines may be prescribed or recommended to relieve symptoms. Medicines may help:  Reduce your fever.  Reduce your cough.  Relieve nasal congestion. HOME CARE INSTRUCTIONS   Take medicines only as directed by your health care provider.   Gargle warm saltwater or take cough drops to comfort your throat as directed by your health care provider.  Use a warm mist humidifier or inhale steam from a shower to increase air moisture. This may make it easier to breathe.  Drink enough fluid to keep your urine clear or pale yellow.   Eat soups and other clear broths and maintain good nutrition.   Rest as needed.   Return to work when your temperature has returned to normal or as your health care provider advises. You may need to stay home longer to avoid infecting others. You can also use a face mask and careful hand washing to prevent spread of the virus.  Increase the usage of your inhaler if you have asthma.   Do not use any tobacco products, including cigarettes, chewing tobacco, or electronic cigarettes. If you need help quitting, ask your health care provider. PREVENTION  The best way to protect yourself from getting a cold is to practice good hygiene.   Avoid oral or hand contact with people with cold symptoms.   Wash your hands often if contact occurs.  There is no clear evidence that vitamin C, vitamin E, echinacea, or exercise reduces the chance of developing a cold. However, it is always recommended to get plenty of rest, exercise, and practice good nutrition.  SEEK MEDICAL CARE IF:   You are getting worse rather than better.   Your symptoms are not controlled by medicine.   You have chills.  You have worsening shortness of breath.  You have brown or red mucus.  You have yellow or brown nasal discharge.  You have pain in your  face, especially when you bend forward.  You have a fever.  You have swollen neck glands.  You have pain while swallowing.  You have white areas in the back of your throat. SEEK IMMEDIATE MEDICAL CARE IF:   You have severe or persistent:  Headache.  Ear pain.  Sinus pain.  Chest pain.  You have chronic lung disease and any of the following:  Wheezing.  Prolonged cough.  Coughing up blood.  A change in your usual mucus.  You have a stiff neck.  You have changes in your:  Vision.  Hearing.  Thinking.  Mood. MAKE SURE YOU:   Understand these instructions.  Will watch your condition.  Will get help right away if you are not doing well or get worse.   This information is not intended to replace advice given to you by your health care provider. Make sure you discuss any questions you have with your health care provider.   Document Released: 06/10/2001 Document Revised: 05/01/2015 Document Reviewed: 03/22/2014 Elsevier Interactive Patient Education 2016 Elsevier Inc.   Cough, Adult Coughing is a reflex that clears your throat and your airways. Coughing helps to heal and protect your lungs. It is normal to cough occasionally, but a cough that happens with other symptoms or lasts a long time may be a sign of a condition that needs treatment. A cough may last only 2-3 weeks (acute), or it may last longer than 8 weeks (chronic). CAUSES Coughing is commonly caused by:  Breathing in substances that irritate your lungs.  A viral or bacterial respiratory infection.  Allergies.  Asthma.  Postnasal drip.  Smoking.  Acid backing up from the stomach into the esophagus (gastroesophageal reflux).  Certain medicines.  Chronic lung problems, including COPD (or rarely, lung cancer).  Other medical conditions such as heart failure. HOME CARE INSTRUCTIONS  Pay attention to any changes in your symptoms. Take these actions to help with your discomfort:  Take  medicines only as told by your health care provider.  If you were prescribed an antibiotic medicine, take it as told by your health care provider. Do not stop taking the antibiotic even if you start to feel better.  Talk with your health care provider before you take a cough suppressant medicine.  Drink enough fluid to keep your urine clear or pale yellow.  If the air is dry, use a cold steam vaporizer or humidifier in your bedroom or your home to help loosen secretions.  Avoid anything that causes you to cough at work or at home.  If your cough is worse at night, try sleeping in a semi-upright position.  Avoid cigarette smoke. If you smoke, quit smoking. If you need help quitting, ask your health care provider.  Avoid caffeine.  Avoid alcohol.  Rest as needed. SEEK MEDICAL CARE IF:   You have new symptoms.  You cough up pus.  Your cough does not get better after 2-3 weeks, or your cough gets worse.  You cannot control your cough with suppressant medicines and you are losing sleep.  You develop pain that is getting worse or pain that is not controlled with pain medicines.  You have a fever.  You have unexplained weight loss.  You have night sweats. SEEK IMMEDIATE MEDICAL CARE IF:  You cough up blood.  You have difficulty breathing.  Your heartbeat is very fast.   This information is not intended to replace advice given to you by your health care provider. Make sure you discuss any questions you have with your health care provider.   Document Released: 06/13/2011 Document Revised: 09/05/2015 Document Reviewed: 02/21/2015 Elsevier Interactive Patient Education [employer]2016 Elsevier Inc.      By signing my name below, I, Rawaa Al Rifaie, attest that this documentation has been  prepared under the direction and in the presence of Meredith Staggers, MD.  Broadus John, Medical Scribe. 11/15/2015.  9:50 AM.  I personally performed the services described in this  documentation, which was scribed in my presence. The recorded information has been reviewed and considered, and addended by me as needed.

## 2015-11-15 NOTE — Patient Instructions (Signed)
Over the counter mucinex or mucinex DM during the day, hydrocodone cough syrup at night only if needed. Drink plenty of fluids.  Return to the clinic or go to the nearest emergency room if any of your symptoms worsen or new symptoms occur.  Upper Respiratory Infection, Adult Most upper respiratory infections (URIs) are a viral infection of the air passages leading to the lungs. A URI affects the nose, throat, and upper air passages. The most common type of URI is nasopharyngitis and is typically referred to as "the common cold." URIs run their course and usually go away on their own. Most of the time, a URI does not require medical attention, but sometimes a bacterial infection in the upper airways can follow a viral infection. This is called a secondary infection. Sinus and middle ear infections are common types of secondary upper respiratory infections. Bacterial pneumonia can also complicate a URI. A URI can worsen asthma and chronic obstructive pulmonary disease (COPD). Sometimes, these complications can require emergency medical care and may be life threatening.  CAUSES Almost all URIs are caused by viruses. A virus is a type of germ and can spread from one person to another.  RISKS FACTORS You may be at risk for a URI if:   You smoke.   You have chronic heart or lung disease.  You have a weakened defense (immune) system.   You are very young or very old.   You have nasal allergies or asthma.  You work in crowded or poorly ventilated areas.  You work in health care facilities or schools. SIGNS AND SYMPTOMS  Symptoms typically develop 2-3 days after you come in contact with a cold virus. Most viral URIs last 7-10 days. However, viral URIs from the influenza virus (flu virus) can last 14-18 days and are typically more severe. Symptoms may include:   Runny or stuffy (congested) nose.   Sneezing.   Cough.   Sore throat.   Headache.   Fatigue.   Fever.   Loss of  appetite.   Pain in your forehead, behind your eyes, and over your cheekbones (sinus pain).  Muscle aches.  DIAGNOSIS  Your health care provider may diagnose a URI by:  Physical exam.  Tests to check that your symptoms are not due to another condition such as:  Strep throat.  Sinusitis.  Pneumonia.  Asthma. TREATMENT  A URI goes away on its own with time. It cannot be cured with medicines, but medicines may be prescribed or recommended to relieve symptoms. Medicines may help:  Reduce your fever.  Reduce your cough.  Relieve nasal congestion. HOME CARE INSTRUCTIONS   Take medicines only as directed by your health care provider.   Gargle warm saltwater or take cough drops to comfort your throat as directed by your health care provider.  Use a warm mist humidifier or inhale steam from a shower to increase air moisture. This may make it easier to breathe.  Drink enough fluid to keep your urine clear or pale yellow.   Eat soups and other clear broths and maintain good nutrition.   Rest as needed.   Return to work when your temperature has returned to normal or as your health care provider advises. You may need to stay home longer to avoid infecting others. You can also use a face mask and careful hand washing to prevent spread of the virus.  Increase the usage of your inhaler if you have asthma.   Do not use any tobacco products,  including cigarettes, chewing tobacco, or electronic cigarettes. If you need help quitting, ask your health care provider. PREVENTION  The best way to protect yourself from getting a cold is to practice good hygiene.   Avoid oral or hand contact with people with cold symptoms.   Wash your hands often if contact occurs.  There is no clear evidence that vitamin C, vitamin E, echinacea, or exercise reduces the chance of developing a cold. However, it is always recommended to get plenty of rest, exercise, and practice good nutrition.    SEEK MEDICAL CARE IF:   You are getting worse rather than better.   Your symptoms are not controlled by medicine.   You have chills.  You have worsening shortness of breath.  You have brown or red mucus.  You have yellow or brown nasal discharge.  You have pain in your face, especially when you bend forward.  You have a fever.  You have swollen neck glands.  You have pain while swallowing.  You have white areas in the back of your throat. SEEK IMMEDIATE MEDICAL CARE IF:   You have severe or persistent:  Headache.  Ear pain.  Sinus pain.  Chest pain.  You have chronic lung disease and any of the following:  Wheezing.  Prolonged cough.  Coughing up blood.  A change in your usual mucus.  You have a stiff neck.  You have changes in your:  Vision.  Hearing.  Thinking.  Mood. MAKE SURE YOU:   Understand these instructions.  Will watch your condition.  Will get help right away if you are not doing well or get worse.   This information is not intended to replace advice given to you by your health care provider. Make sure you discuss any questions you have with your health care provider.   Document Released: 06/10/2001 Document Revised: 05/01/2015 Document Reviewed: 03/22/2014 Elsevier Interactive Patient Education 2016 Elsevier Inc.   Cough, Adult Coughing is a reflex that clears your throat and your airways. Coughing helps to heal and protect your lungs. It is normal to cough occasionally, but a cough that happens with other symptoms or lasts a long time may be a sign of a condition that needs treatment. A cough may last only 2-3 weeks (acute), or it may last longer than 8 weeks (chronic). CAUSES Coughing is commonly caused by:  Breathing in substances that irritate your lungs.  A viral or bacterial respiratory infection.  Allergies.  Asthma.  Postnasal drip.  Smoking.  Acid backing up from the stomach into the esophagus  (gastroesophageal reflux).  Certain medicines.  Chronic lung problems, including COPD (or rarely, lung cancer).  Other medical conditions such as heart failure. HOME CARE INSTRUCTIONS  Pay attention to any changes in your symptoms. Take these actions to help with your discomfort:  Take medicines only as told by your health care provider.  If you were prescribed an antibiotic medicine, take it as told by your health care provider. Do not stop taking the antibiotic even if you start to feel better.  Talk with your health care provider before you take a cough suppressant medicine.  Drink enough fluid to keep your urine clear or pale yellow.  If the air is dry, use a cold steam vaporizer or humidifier in your bedroom or your home to help loosen secretions.  Avoid anything that causes you to cough at work or at home.  If your cough is worse at night, try sleeping in a semi-upright position.  Avoid cigarette smoke. If you smoke, quit smoking. If you need help quitting, ask your health care provider.  Avoid caffeine.  Avoid alcohol.  Rest as needed. SEEK MEDICAL CARE IF:   You have new symptoms.  You cough up pus.  Your cough does not get better after 2-3 weeks, or your cough gets worse.  You cannot control your cough with suppressant medicines and you are losing sleep.  You develop pain that is getting worse or pain that is not controlled with pain medicines.  You have a fever.  You have unexplained weight loss.  You have night sweats. SEEK IMMEDIATE MEDICAL CARE IF:  You cough up blood.  You have difficulty breathing.  Your heartbeat is very fast.   This information is not intended to replace advice given to you by your health care provider. Make sure you discuss any questions you have with your health care provider.   Document Released: 06/13/2011 Document Revised: 09/05/2015 Document Reviewed: 02/21/2015 Elsevier Interactive Patient Education [employer].

## 2015-11-27 ENCOUNTER — Ambulatory Visit (INDEPENDENT_AMBULATORY_CARE_PROVIDER_SITE_OTHER): Payer: Medicare Other | Admitting: Family Medicine

## 2015-11-27 ENCOUNTER — Encounter: Payer: Self-pay | Admitting: Family Medicine

## 2015-11-27 VITALS — BP 138/69 | HR 68 | Temp 98.0°F | Resp 16 | Ht 63.0 in | Wt 204.0 lb

## 2015-11-27 DIAGNOSIS — Z23 Encounter for immunization: Secondary | ICD-10-CM

## 2015-11-27 DIAGNOSIS — R059 Cough, unspecified: Secondary | ICD-10-CM

## 2015-11-27 DIAGNOSIS — I10 Essential (primary) hypertension: Secondary | ICD-10-CM | POA: Diagnosis not present

## 2015-11-27 DIAGNOSIS — E119 Type 2 diabetes mellitus without complications: Secondary | ICD-10-CM | POA: Diagnosis not present

## 2015-11-27 DIAGNOSIS — Z1159 Encounter for screening for other viral diseases: Secondary | ICD-10-CM

## 2015-11-27 DIAGNOSIS — R05 Cough: Secondary | ICD-10-CM

## 2015-11-27 DIAGNOSIS — Z0000 Encounter for general adult medical examination without abnormal findings: Secondary | ICD-10-CM

## 2015-11-27 LAB — HEMOGLOBIN A1C
HEMOGLOBIN A1C: 8 % — AB (ref <5.7)
Mean Plasma Glucose: 183 mg/dL — ABNORMAL HIGH (ref <117)

## 2015-11-27 LAB — BASIC METABOLIC PANEL
BUN: 11 mg/dL (ref 7–25)
CALCIUM: 9 mg/dL (ref 8.6–10.4)
CO2: 28 mmol/L (ref 20–31)
CREATININE: 0.83 mg/dL (ref 0.50–0.99)
Chloride: 100 mmol/L (ref 98–110)
Glucose, Bld: 127 mg/dL — ABNORMAL HIGH (ref 65–99)
Potassium: 4.4 mmol/L (ref 3.5–5.3)
Sodium: 135 mmol/L (ref 135–146)

## 2015-11-27 LAB — HEPATITIS C ANTIBODY: HCV Ab: NEGATIVE

## 2015-11-27 MED ORDER — METFORMIN HCL ER (OSM) 1000 MG PO TB24: ORAL_TABLET | ORAL | Status: DC

## 2015-11-27 MED ORDER — LOSARTAN POTASSIUM 25 MG PO TABS: 25.0000 mg | ORAL_TABLET | Freq: Every day | ORAL | Status: DC

## 2015-11-27 MED ORDER — GLIMEPIRIDE 2 MG PO TABS: 2.0000 mg | ORAL_TABLET | Freq: Every day | ORAL | Status: DC

## 2015-11-27 NOTE — Progress Notes (Signed)
Subjective:    Patient ID: Regina Oneill, female    DOB: Aug 04, 1946, 69 y.o.   MRN: [id number]  HPI This is a pleasant 69 yo female who presents today for CPE. She is a widower who lives alone. She is very involved with her 69 yo grand daughter, driving her places and attending her sporting events and activities. She stays busy with church activities and volunteer work [employer]   Last CPE- 10/15 Mammo- 11/15 Pap- hysterectomy Colonoscopy- Tdap- 2011 Flu- today Eye- regular Dental- wears dentures Exercise-"non- existent"  She was seen 12 days ago with a cough. She was treated with Tussionex and mucinex. She continues to cough without improvement. Some sputum production, not sure what it looks like. Nasal drainage yellow. No SOB.   Hasn't been taking blood sugars at home. Does not cook at home, eats out for a meal and the leftovers for the next meal. Eats fruit loops or peach flavored oatmeal for breakfast. Eats dessert at least once a day.   Review of Systems  Constitutional: Positive for activity change (hasn't been as active while she has had cold). Negative for fever, chills and fatigue.  HENT: Positive for postnasal drip, rhinorrhea and sinus pressure. Negative for sore throat.   Eyes: Negative.   Respiratory: Positive for cough.   Cardiovascular: Negative.   Gastrointestinal: Negative.   Endocrine: Negative.   Genitourinary: Negative.   Musculoskeletal: Positive for arthralgias (right knee).  Allergic/Immunologic: Positive for environmental allergies.  Neurological: Negative.   Hematological: Negative.   Psychiatric/Behavioral: Negative.       Objective:   Physical Exam  Constitutional: She is oriented to person, place, and time. She appears well-developed and well-nourished. No distress.  Obese.  HENT:  Head: Normocephalic and atraumatic.  Right Ear: Tympanic membrane, external ear and ear canal normal.  Left Ear: Tympanic membrane, external ear and  ear canal normal.  Nose: Mucosal edema and rhinorrhea present. Right sinus exhibits no maxillary sinus tenderness and no frontal sinus tenderness. Left sinus exhibits no maxillary sinus tenderness and no frontal sinus tenderness.  Mouth/Throat: Uvula is midline and mucous membranes are normal. No oropharyngeal exudate, posterior oropharyngeal edema, posterior oropharyngeal erythema or tonsillar abscesses.  Eyes: Conjunctivae are normal. Pupils are equal, round, and reactive to light. Right eye exhibits no discharge. Left eye exhibits no discharge.  Neck: Normal range of motion. Neck supple. No JVD present. No thyromegaly present.  Cardiovascular: Normal rate, regular rhythm and normal heart sounds.   Pulmonary/Chest: Effort normal and breath sounds normal. Right breast exhibits no inverted nipple, no mass, no nipple discharge, no skin change and no tenderness. Left breast exhibits no inverted nipple, no mass, no nipple discharge, no skin change and no tenderness. Breasts are symmetrical.  Abdominal: Soft. Bowel sounds are normal. She exhibits no distension and no mass. There is no tenderness. There is no rebound and no guarding.  Musculoskeletal: Normal range of motion. She exhibits no edema or tenderness.  Lymphadenopathy:    She has no cervical adenopathy.  Neurological: She is alert and oriented to person, place, and time.  Skin: Skin is warm and dry. She is not diaphoretic.  Psychiatric: She has a normal mood and affect. Her behavior is normal. Judgment and thought content normal.  Vitals reviewed.  BP 138/69 mmHg  Pulse 68  Temp(Src) 98 F (36.7 C) (Oral)  Resp 16  Ht 5\' 3"  (1.6 m)  Wt 204 lb (92.534 kg)  BMI 36.15 kg/m2  Wt Readings from Last  3 Encounters:  11/27/15 204 lb (92.534 kg)  11/15/15 210 lb (95.255 kg)  08/27/15 206 lb (93.441 kg)   Depression screen [facility] 2/9 11/27/2015 11/15/2015 08/27/2015 11/27/2014 10/16/2014  Decreased Interest 0 0 0 0 0  Down, Depressed, Hopeless 0  0 0 0 0  PHQ - 2 Score 0 0 0 0 0      Assessment & Plan:  1. Annual physical exam   2. Type 2 diabetes mellitus without complication, without long-term current use of insulin (HCC) - patient has attended diabetic education classes and does not wish to go to further classes or nutrition consultation - discussed her diet and encouraged her to add more protein to her meals and snacks and to decrease added sugar. Discussed HgbA1C goal.  - Microalbumin, urine - Basic metabolic panel - Hemoglobin A1c - glimepiride (AMARYL) 2 MG tablet; Take 1 tablet (2 mg total) by mouth daily.  Dispense: 90 tablet; Refill: 3 - metformin (FORTAMET) 1000 MG (OSM) 24 hr tablet; Take one tablet by mouth  daily  Dispense: 90 tablet; Refill: 3  3. Cough - suspect post viral, lungs are clear today and she does not have any SOB - increase fluids, add mucinex, can take OTC antihistamine to dry nasal secretions and can use afrin nasal spray for 3 days for nasal congetion.   4. Essential hypertension - losartan (COZAAR) 25 MG tablet; Take 1 tablet (25 mg total) by mouth daily.  Dispense: 90 tablet; Refill: 3  5. Needs flu shot - Flu Vaccine QUAD 36+ mos IM  6. Need for hepatitis C screening test - Hepatitis C antibody  - follow up 3 months Olean Ree, FNP-BC  [facility]  12/01/2015 5:51 PM

## 2015-11-27 NOTE — Patient Instructions (Addendum)
Try to increase your protein intake- eat an egg with your breakfast For nasal congestion- try otc zyrtec- generic is fine  Use Afrin nasal spray twice a day for 3 days  Cough, Adult Coughing is a reflex that clears your throat and your airways. Coughing helps to heal and protect your lungs. It is normal to cough occasionally, but a cough that happens with other symptoms or lasts a long time may be a sign of a condition that needs treatment. A cough may last only 2-3 weeks (acute), or it may last longer than 8 weeks (chronic). CAUSES Coughing is commonly caused by:  Breathing in substances that irritate your lungs.  A viral or bacterial respiratory infection.  Allergies.  Asthma.  Postnasal drip.  Smoking.  Acid backing up from the stomach into the esophagus (gastroesophageal reflux).  Certain medicines.  Chronic lung problems, including COPD (or rarely, lung cancer).  Other medical conditions such as heart failure. HOME CARE INSTRUCTIONS  Pay attention to any changes in your symptoms. Take these actions to help with your discomfort:  Take medicines only as told by your health care provider.  If you were prescribed an antibiotic medicine, take it as told by your health care provider. Do not stop taking the antibiotic even if you start to feel better.  Talk with your health care provider before you take a cough suppressant medicine.  Drink enough fluid to keep your urine clear or pale yellow.  If the air is dry, use a cold steam vaporizer or humidifier in your bedroom or your home to help loosen secretions.  Avoid anything that causes you to cough at work or at home.  If your cough is worse at night, try sleeping in a semi-upright position.  Avoid cigarette smoke. If you smoke, quit smoking. If you need help quitting, ask your health care provider.  Avoid caffeine.  Avoid alcohol.  Rest as needed. SEEK MEDICAL CARE IF:   You have new symptoms.  You cough up  pus.  Your cough does not get better after 2-3 weeks, or your cough gets worse.  You cannot control your cough with suppressant medicines and you are losing sleep.  You develop pain that is getting worse or pain that is not controlled with pain medicines.  You have a fever.  You have unexplained weight loss.  You have night sweats. SEEK IMMEDIATE MEDICAL CARE IF:  You cough up blood.  You have difficulty breathing.  Your heartbeat is very fast.   This information is not intended to replace advice given to you by your health care provider. Make sure you discuss any questions you have with your health care provider.   Document Released: 06/13/2011 Document Revised: 09/05/2015 Document Reviewed: 02/21/2015 Elsevier Interactive Patient Education [employer]2016 Elsevier Inc.

## 2015-11-28 LAB — MICROALBUMIN, URINE: MICROALB UR: 1 mg/dL

## 2016-01-21 ENCOUNTER — Other Ambulatory Visit (HOSPITAL_COMMUNITY)
Admission: RE | Admit: 2016-01-21 | Discharge: 2016-01-21 | Disposition: A | Discharge status: Home / Self Care | Payer: Medicare Other | Source: Ambulatory Visit | Attending: Internal Medicine | Admitting: Internal Medicine

## 2016-03-25 ENCOUNTER — Encounter: Payer: Self-pay | Admitting: Family Medicine

## 2016-03-25 ENCOUNTER — Ambulatory Visit (INDEPENDENT_AMBULATORY_CARE_PROVIDER_SITE_OTHER): Payer: Medicare Other | Admitting: Family Medicine

## 2016-03-25 VITALS — BP 126/78 | HR 63 | Temp 98.1°F | Resp 16 | Ht 63.0 in | Wt 205.4 lb

## 2016-03-25 DIAGNOSIS — E1065 Type 1 diabetes mellitus with hyperglycemia: Secondary | ICD-10-CM | POA: Diagnosis not present

## 2016-03-25 DIAGNOSIS — I10 Essential (primary) hypertension: Secondary | ICD-10-CM | POA: Diagnosis not present

## 2016-03-25 LAB — POCT GLYCOSYLATED HEMOGLOBIN (HGB A1C): Hemoglobin A1C: 7.7

## 2016-03-25 MED ORDER — METFORMIN HCL ER 750 MG PO TB24: 750.0000 mg | ORAL_TABLET | Freq: Two times a day (BID) | ORAL | Status: DC

## 2016-03-25 NOTE — Progress Notes (Signed)
Subjective:    Patient ID: Regina Oneill, female    DOB: 02/09/1946, 70 y.o.   MRN: [id number]  HPI This is a pleasant 70 yo female who presents today for follow up of DM2, htn. She has been participating in the [facility] Diabetes program. She has been tracking her blood sugars, steps and weight. She has noticed that the glucometer they provided is different from her readings.   Knee pain limits her activity level. Knee pain comes and goes. She saw orthopedics in the past. Doesn't remember what she was told. Uses OTC cream (triderma MD) or Advil arthrits (rarely). Doesn't interfere with sleep.   Past Medical History  Diagnosis Date  . Arthritis     knee - no meds  . Diabetes mellitus     recently dx in 12/12  . Hypertension     recently dx in 12/12   Past Surgical History  Procedure Laterality Date  . Knee arthroscopy  06/2000    right  . Multiple tooth extractions      all teeth extracted  . Svd      x 3  . Cholecystectomy    . Colonoscopy    . Dilation and curettage of uterus    . Hysteroscopy w/d&c  02/09/2012    Procedure: DILATATION AND CURETTAGE /HYSTEROSCOPY;  Surgeon: Robley Fries, MD;  Location: [facility];  Service: Gynecology;  Laterality: N/A;  true clear   Family History  Problem Relation Age of Onset  . Alzheimer's disease Mother   . Diabetes Sister   . Lupus Sister    Social History  Substance Use Topics  . Smoking status: Never Smoker   . Smokeless tobacco: Never Used  . Alcohol Use: No      Review of Systems No chest pain, no SOB, no edema    Objective:   Physical Exam Physical Exam  Constitutional: Oriented to person, place, and time. She appears well-developed and well-nourished.  HENT:  Head: Normocephalic and atraumatic.  Eyes: Conjunctivae are normal.  Neck: Normal range of motion. Neck supple.  Cardiovascular: Normal rate, regular rhythm and normal heart sounds.   Pulmonary/Chest: Effort normal and breath sounds normal.    Musculoskeletal: Normal range of motion.  Neurological: Alert and oriented to person, place, and time.  Skin: Skin is warm and dry.  Psychiatric: Normal mood and affect. Behavior is normal. Judgment and thought content normal.  Vitals reviewed.  BP 126/78 mmHg  Pulse 63  Temp(Src) 98.1 F (36.7 C) (Oral)  Resp 16  Ht  (1.6 m)  Wt 205 lb 6.4 oz (93.169 kg)  BMI 36.39 kg/m2  SpO2 96% Wt Readings from Last 3 Encounters:  03/25/16 205 lb 6.4 oz (93.169 kg)  11/27/15 204 lb (92.534 kg)  11/15/15 210 lb (95.255 kg)   Results for orders placed or performed in visit on 03/25/16  POCT glycosylated hemoglobin (Hb A1C)  Result Value Ref Range   Hemoglobin A1C 7.7        Assessment & Plan:  1. Type 1 diabetes mellitus with hyperglycemia (HCC) - improved HbA1C- 8.0 to 7.7, will increase metformin from 1000 mg po qd to 750 mg po BID - POCT glycosylated hemoglobin (Hb A1C) - HM Diabetes Foot Exam - metFORMIN (GLUCOPHAGE-XR) 750 MG 24 hr tablet; Take 1 tablet (750 mg total) by mouth 2 (two) times daily.  Dispense: 60 tablet; Refill: 5  2. Essential hypertension - well controlled, continue current meds  - follow up 4 months,  sooner if increasing knee pain or increased blood sugars   Olean Reeeborah Arlena Marsan, FNP-BC  [facility]  03/25/2016 8:54 AM

## 2016-03-25 NOTE — Patient Instructions (Addendum)
Please call in June for a follow up appointment in July  Please schedule your mammogram

## 2016-05-02 ENCOUNTER — Ambulatory Visit (INDEPENDENT_AMBULATORY_CARE_PROVIDER_SITE_OTHER): Payer: Medicare Other | Admitting: Urgent Care

## 2016-05-02 ENCOUNTER — Ambulatory Visit (INDEPENDENT_AMBULATORY_CARE_PROVIDER_SITE_OTHER): Payer: Medicare Other

## 2016-05-02 VITALS — BP 154/78 | HR 79 | Temp 99.1°F | Resp 16 | Ht 63.0 in | Wt 205.4 lb

## 2016-05-02 DIAGNOSIS — M1990 Unspecified osteoarthritis, unspecified site: Secondary | ICD-10-CM | POA: Diagnosis not present

## 2016-05-02 DIAGNOSIS — M25552 Pain in left hip: Secondary | ICD-10-CM

## 2016-05-02 DIAGNOSIS — R05 Cough: Secondary | ICD-10-CM

## 2016-05-02 DIAGNOSIS — R0989 Other specified symptoms and signs involving the circulatory and respiratory systems: Secondary | ICD-10-CM

## 2016-05-02 DIAGNOSIS — M1612 Unilateral primary osteoarthritis, left hip: Secondary | ICD-10-CM

## 2016-05-02 DIAGNOSIS — R059 Cough, unspecified: Secondary | ICD-10-CM

## 2016-05-02 LAB — POCT CBC
GRANULOCYTE PERCENT: 80 % (ref 37–80)
HEMATOCRIT: 38.2 % (ref 37.7–47.9)
HEMOGLOBIN: 12.9 g/dL (ref 12.2–16.2)
LYMPH, POC: 1.6 (ref 0.6–3.4)
MCH: 28.5 pg (ref 27–31.2)
MCHC: 33.8 g/dL (ref 31.8–35.4)
MCV: 84.4 fL (ref 80–97)
MID (cbc): 0.6 (ref 0–0.9)
MPV: 7.4 fL (ref 0–99.8)
POC GRANULOCYTE: 9 — AB (ref 2–6.9)
POC LYMPH PERCENT: 14.6 %L (ref 10–50)
POC MID %: 5.4 % (ref 0–12)
Platelet Count, POC: 388 10*3/uL (ref 142–424)
RBC: 4.52 M/uL (ref 4.04–5.48)
RDW, POC: 14 %
WBC: 11.3 10*3/uL — AB (ref 4.6–10.2)

## 2016-05-02 LAB — COMPREHENSIVE METABOLIC PANEL
ALK PHOS: 87 U/L (ref 33–130)
ALT: 14 U/L (ref 6–29)
AST: 15 U/L (ref 10–35)
Albumin: 3.9 g/dL (ref 3.6–5.1)
BUN: 15 mg/dL (ref 7–25)
CO2: 28 mmol/L (ref 20–31)
CREATININE: 0.8 mg/dL (ref 0.60–0.93)
Calcium: 9.8 mg/dL (ref 8.6–10.4)
Chloride: 98 mmol/L (ref 98–110)
GLUCOSE: 72 mg/dL (ref 65–99)
Potassium: 4.1 mmol/L (ref 3.5–5.3)
SODIUM: 139 mmol/L (ref 135–146)
Total Bilirubin: 0.4 mg/dL (ref 0.2–1.2)
Total Protein: 7.3 g/dL (ref 6.1–8.1)

## 2016-05-02 MED ORDER — AZITHROMYCIN 250 MG PO TABS: ORAL_TABLET | ORAL | Status: DC

## 2016-05-02 MED ORDER — MELOXICAM 7.5 MG PO TABS: 7.5000 mg | ORAL_TABLET | Freq: Every day | ORAL | Status: DC

## 2016-05-02 MED ORDER — HYDROCODONE-HOMATROPINE 5-1.5 MG/5ML PO SYRP: 5.0000 mL | ORAL_SOLUTION | Freq: Every evening | ORAL | Status: DC | PRN

## 2016-05-02 MED ORDER — BENZONATATE 100 MG PO CAPS: 100.0000 mg | ORAL_CAPSULE | Freq: Three times a day (TID) | ORAL | Status: DC | PRN

## 2016-05-02 NOTE — Patient Instructions (Addendum)
Cough, Adult Coughing is a reflex that clears your throat and your airways. Coughing helps to heal and protect your lungs. It is normal to cough occasionally, but a cough that happens with other symptoms or lasts a long time may be a sign of a condition that needs treatment. A cough may last only 2-3 weeks (acute), or it may last longer than 8 weeks (chronic). CAUSES Coughing is commonly caused by:  Breathing in substances that irritate your lungs.  A viral or bacterial respiratory infection.  Allergies.  Asthma.  Postnasal drip.  Smoking.  Acid backing up from the stomach into the esophagus (gastroesophageal reflux).  Certain medicines.  Chronic lung problems, including COPD (or rarely, lung cancer).  Other medical conditions such as heart failure. HOME CARE INSTRUCTIONS  Pay attention to any changes in your symptoms. Take these actions to help with your discomfort:  Take medicines only as told by your health care provider.  If you were prescribed an antibiotic medicine, take it as told by your health care provider. Do not stop taking the antibiotic even if you start to feel better.  Talk with your health care provider before you take a cough suppressant medicine.  Drink enough fluid to keep your urine clear or pale yellow.  If the air is dry, use a cold steam vaporizer or humidifier in your bedroom or your home to help loosen secretions.  Avoid anything that causes you to cough at work or at home.  If your cough is worse at night, try sleeping in a semi-upright position.  Avoid cigarette smoke. If you smoke, quit smoking. If you need help quitting, ask your health care provider.  Avoid caffeine.  Avoid alcohol.  Rest as needed. SEEK MEDICAL CARE IF:   You have new symptoms.  You cough up pus.  Your cough does not get better after 2-3 weeks, or your cough gets worse.  You cannot control your cough with suppressant medicines and you are losing sleep.  You  develop pain that is getting worse or pain that is not controlled with pain medicines.  You have a fever.  You have unexplained weight loss.  You have night sweats. SEEK IMMEDIATE MEDICAL CARE IF:  You cough up blood.  You have difficulty breathing.  Your heartbeat is very fast.   This information is not intended to replace advice given to you by your health care provider. Make sure you discuss any questions you have with your health care provider.   Document Released: 06/13/2011 Document Revised: 09/05/2015 Document Reviewed: 02/21/2015 Elsevier Interactive Patient Education 2016 Elsevier Inc.    Hip Pain Your hip is the joint between your upper legs and your lower pelvis. The bones, cartilage, tendons, and muscles of your hip joint perform a lot of work each day supporting your body weight and allowing you to move around. Hip pain can range from a minor ache to severe pain in one or both of your hips. Pain may be felt on the inside of the hip joint near the groin, or the outside near the buttocks and upper thigh. You may have swelling or stiffness as well.  HOME CARE INSTRUCTIONS   Take medicines only as directed by your health care provider.  Apply ice to the injured area:  Put ice in a plastic bag.  Place a towel between your skin and the bag.  Leave the ice on for 15-20 minutes at a time, 3-4 times a day.  Keep your leg raised (elevated) when  possible to lessen swelling.  Avoid activities that cause pain.  Follow specific exercises as directed by your health care provider.  Sleep with a pillow between your legs on your most comfortable side.  Record how often you have hip pain, the location of the pain, and what it feels like. SEEK MEDICAL CARE IF:   You are unable to put weight on your leg.  Your hip is red or swollen or very tender to touch.  Your pain or swelling continues or worsens after 1 week.  You have increasing difficulty walking.  You have a  fever. SEEK IMMEDIATE MEDICAL CARE IF:   You have fallen.  You have a sudden increase in pain and swelling in your hip. MAKE SURE YOU:   Understand these instructions.  Will watch your condition.  Will get help right away if you are not doing well or get worse.   This information is not intended to replace advice given to you by your health care provider. Make sure you discuss any questions you have with your health care provider.   Document Released: 06/04/2010 Document Revised: 01/05/2015 Document Reviewed: 08/11/2013 Elsevier Interactive Patient Education [employer]2016 Elsevier Inc.     IF you received an x-ray today, you will receive an invoice from [facility]. Please contact [facility] at [phone] with questions or concerns regarding your invoice.   IF you received labwork today, you will receive an invoice from [facility]. Please contact [facility] at [phone] with questions or concerns regarding your invoice.   Our billing staff will not be able to assist you with questions regarding bills from these companies.  You will be contacted with the lab results as soon as they are available. The fastest way to get your results is to activate your My Chart account. Instructions are located on the last page of this paperwork. If you have not heard from us regarding the results in 2 weeks, please contact this office.

## 2016-05-02 NOTE — Progress Notes (Signed)
MRN: [id number] DOB: [birth year]  Subjective:   Regina Oneill is a 70 y.o. female presenting for chief complaint of Cough and Pain  Cough - Reports 1 day history productive cough, chest pain, runny nose, sore neck. Has not tried any medications for these symptoms. Denies fever, sinus pain, ear pain, sore throat, chest pain, shob, n/v, abdominal pain. Denies history of seasonal allergies, asthma. Denies smoking cigarettes.  Hip Pain - Reports 1 week history of left hip/upper thigh pain. Pain is dull, nagging with occasional sharp pain, pain is constant, does not radiate. Has tried Alleve for arthritis with minimal relief, also used hot patches. Denies trauma, falls. Has a history of arthritis in her right knee.  Regina Oneill has a current medication list which includes the following prescription(s): aspirin ec, fluocinonide cream, glimepiride, losartan, and metformin. Also is allergic to lisinopril.  Regina Oneill  has a past medical history of Arthritis; Diabetes mellitus; and Hypertension. Also  has past surgical history that includes Knee arthroscopy (06/2000); Multiple tooth extractions; SVD; Cholecystectomy; Colonoscopy; Dilation and curettage of uterus; and Hysteroscopy w/D&C (02/09/2012).  Objective:   Vitals: BP 154/78 mmHg  Pulse 79  Temp(Src) 99.1 F (37.3 C) (Oral)  Resp 16  Ht 5\' 3"  (1.6 m)  Wt 205 lb 6.4 oz (93.169 kg)  BMI 36.39 kg/m2  SpO2 95%  Physical Exam  Constitutional: She is oriented to person, place, and time. She appears well-developed and well-nourished.  HENT:  TM's intact bilaterally, no effusions or erythema. Nasal turbinates pink and moist, nasal passages patent. No sinus tenderness. Oropharynx with slight post-nasal drainage, mucous membranes moist, dentition in good repair.  Eyes: Right eye exhibits no discharge. Left eye exhibits no discharge. No scleral icterus.  Neck: Normal range of motion. Neck supple.  Cardiovascular: Normal rate, regular rhythm and  intact distal pulses.  Exam reveals no gallop and no friction rub.   No murmur heard. Pulmonary/Chest: No respiratory distress. She has no wheezes. She has no rales.  Musculoskeletal: She exhibits edema (trace pitting edema up to mid calf bilaterally).       Left hip: She exhibits normal range of motion, normal strength, no tenderness, no bony tenderness, no swelling, no crepitus, no deformity and no laceration.  Lymphadenopathy:    She has no cervical adenopathy.  Neurological: She is alert and oriented to person, place, and time.  Skin: Skin is warm and dry.   Dg Chest 2 View  05/02/2016  CLINICAL DATA:  Chest pain, productive cough, runny nose EXAM: CHEST  2 VIEW COMPARISON:  05/14/2012 FINDINGS: Cardiomediastinal silhouette is stable. No acute infiltrate or pleural effusion. No pulmonary edema. Mild degenerative changes mid and lower thoracic spine. IMPRESSION: No active cardiopulmonary disease. Electronically Signed   By: Natasha MeadLiviu  Pop M.D.   On: 05/02/2016 17:15   Dg Hip Unilat W Or W/o Pelvis 2-3 Views Left  05/02/2016  CLINICAL DATA:  One week of pain with no trauma. EXAM: DG HIP (WITH OR WITHOUT PELVIS) 2-3V LEFT COMPARISON:  None. FINDINGS: Mild degenerative changes in the left hip with no fracture or dislocation. 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 IMPRESSION: 1. Mild degenerative changes in the left hip with no fracture. 2. 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 Electronically Signed   By: Gerome Samavid  Williams III M.D   On: 05/02/2016 17:17   Results for orders placed or performed in visit on 05/02/16 (from the past 24  hour(s))  POCT CBC     Status: Abnormal   Collection Time: 05/02/16  5:20 PM  Result Value Ref Range   WBC 11.3 (A) 4.6 - 10.2 K/uL   Lymph, poc 1.6 0.6 - 3.4   POC LYMPH PERCENT 14.6 10 - 50 %L   MID (cbc) 0.6 0 - 0.9   POC MID % 5.4 0 - 12 %M   POC  Granulocyte 9.0 (A) 2 - 6.9   Granulocyte percent 80.0 37 - 80 %G   RBC 4.52 4.04 - 5.48 M/uL   Hemoglobin 12.9 12.2 - 16.2 g/dL   HCT, POC 16.1 09.6 - 47.9 %   MCV 84.4 80 - 97 fL   MCH, POC 28.5 27 - 31.2 pg   MCHC 33.8 31.8 - 35.4 g/dL   RDW, POC 04.5 %   Platelet Count, POC 388 142 - 424 K/uL   MPV 7.4 0 - 99.8 fL   Assessment and Plan :   1. Arthritis of left hip 2. Left hip pain - Will manage conservatively with meloxicam. Patient agreed to rtc in 1-2 weeks if no improvement.  3. Cough 4. Chest congestion - Will manage as a viral URI with supportive care. Start azithromycin if no improvement in 3 days. Recheck if no improvement in 1 week.  Wallis Bamberg, PA-C [facility] [phone] 05/02/2016 4:26 PM

## 2016-05-19 ENCOUNTER — Ambulatory Visit (INDEPENDENT_AMBULATORY_CARE_PROVIDER_SITE_OTHER): Payer: Medicare Other | Admitting: Physician Assistant

## 2016-05-19 VITALS — BP 114/60 | HR 75 | Temp 98.3°F | Resp 16 | Ht 63.0 in | Wt 202.0 lb

## 2016-05-19 DIAGNOSIS — R1032 Left lower quadrant pain: Secondary | ICD-10-CM | POA: Diagnosis not present

## 2016-05-19 DIAGNOSIS — Z8719 Personal history of other diseases of the digestive system: Secondary | ICD-10-CM

## 2016-05-19 LAB — POCT CBC
Granulocyte percent: 74.5 %G (ref 37–80)
HEMATOCRIT: 38.1 % (ref 37.7–47.9)
HEMOGLOBIN: 12.6 g/dL (ref 12.2–16.2)
LYMPH, POC: 2.7 (ref 0.6–3.4)
MCH, POC: 27.6 pg (ref 27–31.2)
MCHC: 33.1 g/dL (ref 31.8–35.4)
MCV: 83.3 fL (ref 80–97)
MID (cbc): 0.8 (ref 0–0.9)
MPV: 7 fL (ref 0–99.8)
POC Granulocyte: 10.4 — AB (ref 2–6.9)
POC LYMPH %: 19.4 % (ref 10–50)
POC MID %: 6.1 %M (ref 0–12)
Platelet Count, POC: 424 10*3/uL (ref 142–424)
RBC: 4.58 M/uL (ref 4.04–5.48)
RDW, POC: 14.7 %
WBC: 13.9 10*3/uL — AB (ref 4.6–10.2)

## 2016-05-19 MED ORDER — METRONIDAZOLE 500 MG PO TABS: 500.0000 mg | ORAL_TABLET | Freq: Three times a day (TID) | ORAL | Status: DC

## 2016-05-19 MED ORDER — CIPROFLOXACIN HCL 500 MG PO TABS: 500.0000 mg | ORAL_TABLET | Freq: Two times a day (BID) | ORAL | Status: DC

## 2016-05-19 NOTE — Patient Instructions (Addendum)
Hold your glimepiride until you finish treatment for diverticulitis.      IF you received an x-ray today, you will receive an invoice from [facility]. Please contact [facility] at [phone] with questions or concerns regarding your invoice.   IF you received labwork today, you will receive an invoice from [facility]. Please contact [facility] at [phone] with questions or concerns regarding your invoice.   Our billing staff will not be able to assist you with questions regarding bills from these companies.  You will be contacted with the lab results as soon as they are available. The fastest way to get your results is to activate your My Chart account. Instructions are located on the last page of this paperwork. If you have not heard from us regarding the results in 2 weeks, please contact this office.

## 2016-05-19 NOTE — Progress Notes (Signed)
05/20/2016 3:17 PM   DOB: June 11, 1946 / MRN: [id number]  SUBJECTIVE:  Regina Oneill is a 70 y.o. female presenting for LLQ abdominal pain that started last night.  She reports a history of diverticulitis which is confirmed in [facility]. She describes the pain as a dull ache.  Feels that she is defecating more often today.  Denies hematochezia, melena, nausea, emesis, dysuria.  She is allergic to lisinopril.   She  has a past medical history of Arthritis; Diabetes mellitus; and Hypertension.    She  reports that she has never smoked. She has never used smokeless tobacco. She reports that she does not drink alcohol or use illicit drugs. She  reports that she does not engage in sexual activity. The patient  has past surgical history that includes Knee arthroscopy (06/2000); Multiple tooth extractions; SVD; Cholecystectomy; Colonoscopy; Dilation and curettage of uterus; and Hysteroscopy w/D&C (02/09/2012).  Her family history includes Alzheimer's disease in her mother; Diabetes in her sister; Lupus in her sister.  Review of Systems  Constitutional: Negative for fever and chills.  Eyes: Negative for blurred vision.  Respiratory: Negative for cough and shortness of breath.   Cardiovascular: Negative for chest pain.  Gastrointestinal: Negative for nausea and abdominal pain.  Genitourinary: Negative for dysuria, urgency and frequency.  Musculoskeletal: Negative for myalgias.  Skin: Negative for rash.  Neurological: Negative for dizziness, tingling and headaches.  Psychiatric/Behavioral: Negative for depression. The patient is not nervous/anxious.     Problem list and medications reviewed and updated by myself where necessary, and exist elsewhere in the encounter.   OBJECTIVE:  BP 114/60 mmHg  Pulse 75  Temp(Src) 98.3 F (36.8 C) (Oral)  Resp 16  Ht 5\' 3"  (1.6 m)  Wt 202 lb (91.627 kg)  BMI 35.79 kg/m2  SpO2 98%  Physical Exam  Constitutional: She is oriented to person, place, and time.  She appears well-nourished. No distress.  Eyes: EOM are normal. Pupils are equal, round, and reactive to light.  Cardiovascular: Normal rate.   Pulmonary/Chest: Effort normal.  Abdominal: She exhibits no distension.  Neurological: She is alert and oriented to person, place, and time. No cranial nerve deficit. Gait normal.  Skin: Skin is dry. She is not diaphoretic.  Psychiatric: She has a normal mood and affect.  Vitals reviewed.   Results for orders placed or performed in visit on 05/19/16 (from the past 72 hour(s))  POCT CBC     Status: Abnormal   Collection Time: 05/19/16  6:51 PM  Result Value Ref Range   WBC 13.9 (A) 4.6 - 10.2 K/uL   Lymph, poc 2.7 0.6 - 3.4   POC LYMPH PERCENT 19.4 10 - 50 %L   MID (cbc) 0.8 0 - 0.9   POC MID % 6.1 0 - 12 %M   POC Granulocyte 10.4 (A) 2 - 6.9   Granulocyte percent 74.5 37 - 80 %G   RBC 4.58 4.04 - 5.48 M/uL   Hemoglobin 12.6 12.2 - 16.2 g/dL   HCT, POC 95.638.1 21.337.7 - 47.9 %   MCV 83.3 80 - 97 fL   MCH, POC 27.6 27 - 31.2 pg   MCHC 33.1 31.8 - 35.4 g/dL   RDW, POC 08.614.7 %   Platelet Count, POC 424 142 - 424 K/uL   MPV 7.0 0 - 99.8 fL    No results found.  ASSESSMENT AND PLAN  Regina Oneill was seen today for abdominal pain.  Diagnoses and all orders for this visit:  LLQ  abdominal pain: Given her history and exam will treat for diverticular disease.  Patient is anxious about cipro given risk of tendon rupture. Will start Augmentin. She is amenable to 24 to 48 hour follow up as needed.  -     POCT CBC -     amoxicillin-clavulanate (AUGMENTIN) 875-125 MG tablet; Take 1 tablet by mouth 2 (two) times daily.  History of diverticulitis  Other orders -     Discontinue: ciprofloxacin (CIPRO) 500 MG tablet; Take 1 tablet (500 mg total) by mouth 2 (two) times daily. -     Discontinue: metroNIDAZOLE (FLAGYL) 500 MG tablet; Take 1 tablet (500 mg total) by mouth 3 (three) times daily.    The patient was advised to call or return to clinic if she  does not see an improvement in symptoms or to seek the care of the closest emergency department if she worsens with the above plan.   Deliah Boston, MHS, PA-C [facility] 05/20/2016 3:17 PM

## 2016-05-20 ENCOUNTER — Telehealth: Payer: Self-pay

## 2016-05-20 ENCOUNTER — Other Ambulatory Visit: Payer: Self-pay | Admitting: Physician Assistant

## 2016-05-20 MED ORDER — AMOXICILLIN-POT CLAVULANATE 875-125 MG PO TABS: 1.0000 | ORAL_TABLET | Freq: Two times a day (BID) | ORAL | Status: DC

## 2016-05-20 NOTE — Telephone Encounter (Signed)
Sure. I will write for amox-clav.  Sending it now.  Deliah BostonMichael Kora Groom, MS, PA-C 12:12 PM, 05/20/2016

## 2016-05-20 NOTE — Telephone Encounter (Signed)
Pt advised.

## 2016-05-20 NOTE — Telephone Encounter (Signed)
Pt was here on Monday and Clark prescribed two medications for her.  After she read the information on the cipro, she is afraid to take it due to possible side effects.  Can we replace it with something else.  [phone]

## 2016-06-27 ENCOUNTER — Ambulatory Visit (INDEPENDENT_AMBULATORY_CARE_PROVIDER_SITE_OTHER): Payer: Medicare Other | Admitting: Physician Assistant

## 2016-06-27 VITALS — BP 116/78 | HR 80 | Temp 98.5°F | Resp 18 | Ht 63.0 in | Wt 201.0 lb

## 2016-06-27 DIAGNOSIS — M542 Cervicalgia: Secondary | ICD-10-CM | POA: Diagnosis not present

## 2016-06-27 DIAGNOSIS — M25561 Pain in right knee: Secondary | ICD-10-CM | POA: Diagnosis not present

## 2016-06-27 DIAGNOSIS — M545 Low back pain, unspecified: Secondary | ICD-10-CM

## 2016-06-27 DIAGNOSIS — M79601 Pain in right arm: Secondary | ICD-10-CM

## 2016-06-27 NOTE — Patient Instructions (Addendum)
Use ICE for the knee and heat for the arm, neck and low back.  Use acetaminophen (Tylenol) for pain as needed.  When you come back in July for follow-up of diabetes, you'll get established with one of our other providers, since both Dr. Milus GlazierLauenstein and Deboraha Sprangebbie Gessner have left.    IF you received an x-ray today, you will receive an invoice from [facility]. Please contact [facility] at [phone] with questions or concerns regarding your invoice.   IF you received labwork today, you will receive an invoice from [facility]. Please contact [facility] at [phone] with questions or concerns regarding your invoice.   Our billing staff will not be able to assist you with questions regarding bills from these companies.  You will be contacted with the lab results as soon as they are available. The fastest way to get your results is to activate your My Chart account. Instructions are located on the last page of this paperwork. If you have not heard from us regarding the results in 2 weeks, please contact this office.    We recommend that you schedule a mammogram for breast cancer screening. Typically, you do not need a referral to do this. Please contact a local imaging center to schedule your mammogram.  [facility] - [phone]  *ask for the Radiology Department [facility] - [phone] or [phone]  [facility] - [phone] [facility] - [phone] [facility] - [phone]  *ask for the Radiology Department [facility] - [phone]  *ask for the Radiology Department [facility] - [phone]  *ask for the Mammography Department [facility] - [phone]
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
## 2016-07-28 ENCOUNTER — Ambulatory Visit (INDEPENDENT_AMBULATORY_CARE_PROVIDER_SITE_OTHER): Payer: Medicare Other | Admitting: Physician Assistant

## 2016-07-28 VITALS — BP 118/72 | HR 71 | Temp 98.0°F | Resp 18 | Ht 63.0 in | Wt 201.0 lb

## 2016-07-28 DIAGNOSIS — E119 Type 2 diabetes mellitus without complications: Secondary | ICD-10-CM | POA: Diagnosis not present

## 2016-07-28 DIAGNOSIS — I10 Essential (primary) hypertension: Secondary | ICD-10-CM

## 2016-07-28 DIAGNOSIS — E1065 Type 1 diabetes mellitus with hyperglycemia: Secondary | ICD-10-CM

## 2016-07-28 DIAGNOSIS — L309 Dermatitis, unspecified: Secondary | ICD-10-CM

## 2016-07-28 LAB — COMPLETE METABOLIC PANEL WITH GFR
ALT: 13 U/L (ref 6–29)
AST: 13 U/L (ref 10–35)
Albumin: 3.8 g/dL (ref 3.6–5.1)
Alkaline Phosphatase: 82 U/L (ref 33–130)
BUN: 13 mg/dL (ref 7–25)
CALCIUM: 8.9 mg/dL (ref 8.6–10.4)
CHLORIDE: 99 mmol/L (ref 98–110)
CO2: 28 mmol/L (ref 20–31)
Creat: 0.85 mg/dL (ref 0.60–0.93)
GFR, EST AFRICAN AMERICAN: 80 mL/min (ref >=60)
GFR, EST NON AFRICAN AMERICAN: 70 mL/min (ref >=60)
Glucose, Bld: 71 mg/dL (ref 65–99)
POTASSIUM: 4.6 mmol/L (ref 3.5–5.3)
Sodium: 136 mmol/L (ref 135–146)
Total Bilirubin: 0.4 mg/dL (ref 0.2–1.2)
Total Protein: 7 g/dL (ref 6.1–8.1)

## 2016-07-28 LAB — POCT GLYCOSYLATED HEMOGLOBIN (HGB A1C): Hemoglobin A1C: 7

## 2016-07-28 MED ORDER — LOSARTAN POTASSIUM 25 MG PO TABS: 25.0000 mg | ORAL_TABLET | Freq: Every day | ORAL | 3 refills | Status: DC

## 2016-07-28 MED ORDER — FLUOCINONIDE 0.05 % EX CREA
1.0000 | TOPICAL_CREAM | Freq: Two times a day (BID) | CUTANEOUS | 6 refills | Status: AC | PRN
Start: 2016-07-28 — End: ?

## 2016-07-28 MED ORDER — METFORMIN HCL ER 750 MG PO TB24: 750.0000 mg | ORAL_TABLET | Freq: Two times a day (BID) | ORAL | 1 refills | Status: AC

## 2016-07-28 MED ORDER — GLIMEPIRIDE 2 MG PO TABS: 2.0000 mg | ORAL_TABLET | Freq: Every day | ORAL | 1 refills | Status: AC

## 2016-07-28 NOTE — Patient Instructions (Signed)
     IF you received an x-ray today, you will receive an invoice from [facility]. Please contact [facility] at [phone] with questions or concerns regarding your invoice.   IF you received labwork today, you will receive an invoice from [facility]. Please contact [facility] at [phone] with questions or concerns regarding your invoice.   Our billing staff will not be able to assist you with questions regarding bills from these companies.  You will be contacted with the lab results as soon as they are available. The fastest way to get your results is to activate your My Chart account. Instructions are located on the last page of this paperwork. If you have not heard from us regarding the results in 2 weeks, please contact this office.      

## 2016-07-28 NOTE — Progress Notes (Signed)
   Regina Oneill  MRN: [id number] DOB: 1946-09-10  Subjective:  Pt presents to clinic for DM check.  Her glucose at home 70-110s.  She does not check her BP at home.  She tries to eat healthy but does not get any exercise.  She feels good.  Review of Systems  Constitutional: Negative for chills and fever.  Respiratory: Negative for shortness of breath.   Cardiovascular: Negative for chest pain, palpitations and leg swelling.  Skin: Negative for wound.  Neurological: Negative for numbness.    Patient Active Problem List   Diagnosis Date Noted  . Hypertension 07/27/2012  . DM (diabetes mellitus) (HCC) 03/26/2012  . Postmenopausal bleeding 02/09/2012    Current Outpatient Prescriptions on File Prior to Visit  Medication Sig Dispense Refill  . aspirin EC 81 MG tablet Take 81 mg by mouth daily.    . fluocinonide cream (LIDEX) 0.05 % Apply 1 application topically 2 (two) times daily as needed. For eczema 30 g 6  . glimepiride (AMARYL) 2 MG tablet Take 1 tablet (2 mg total) by mouth daily. 90 tablet 3  . losartan (COZAAR) 25 MG tablet Take 1 tablet (25 mg total) by mouth daily. 90 tablet 3  . metFORMIN (GLUCOPHAGE-XR) 750 MG 24 hr tablet Take 1 tablet (750 mg total) by mouth 2 (two) times daily. 60 tablet 5   No current facility-administered medications on file prior to visit.     Allergies  Allergen Reactions  . Lisinopril Cough    Pt patients social history was reviewed and updated.  Objective:  BP 118/72   Pulse 71   Temp 98 F (36.7 C) (Oral)   Resp 18   Ht 5\' 3"  (1.6 m)   Wt 201 lb (91.2 kg)   SpO2 97%   BMI 35.61 kg/m   Physical Exam  Constitutional: She is oriented to person, place, and time and well-developed, well-nourished, and in no distress.  HENT:  Head: Normocephalic and atraumatic.  Right Ear: Hearing and external ear normal.  Left Ear: Hearing and external ear normal.  Eyes: Conjunctivae are normal.  Neck: Normal range of motion.  Cardiovascular:  Normal rate, regular rhythm and normal heart sounds.   No murmur heard. Pulmonary/Chest: Effort normal and breath sounds normal.  Musculoskeletal:       Right lower leg: She exhibits no edema.       Left lower leg: She exhibits no edema.  Neurological: She is alert and oriented to person, place, and time. Gait normal.  Skin: Skin is warm and dry.  Psychiatric: Mood, memory, affect and judgment normal.  Vitals reviewed.  Results for orders placed or performed in visit on 07/28/16  POCT glycosylated hemoglobin (Hb A1C)  Result Value Ref Range   Hemoglobin A1C 7.0     Assessment and Plan :  Type 2 diabetes mellitus without complication, without long-term current use of insulin (HCC) - Plan: POCT glycosylated hemoglobin (Hb A1C), COMPLETE METABOLIC PANEL WITH GFR - controlled -- continue current medications -   Essential hypertension   Encouraged patient to have an eye exam yearly and have the results sent to [place].  Benny Lennert PA-C  [facility] 07/28/2016 12:31 PM

## 2016-07-30 ENCOUNTER — Encounter: Payer: Self-pay | Admitting: Physician Assistant

## 2016-08-05 ENCOUNTER — Encounter: Payer: Self-pay | Admitting: Family Medicine

## 2016-09-24 ENCOUNTER — Ambulatory Visit (INDEPENDENT_AMBULATORY_CARE_PROVIDER_SITE_OTHER): Payer: Medicare Other | Admitting: Physician Assistant

## 2016-09-24 VITALS — BP 122/80 | HR 77 | Temp 99.0°F | Resp 17 | Ht 63.0 in | Wt 201.0 lb

## 2016-09-24 DIAGNOSIS — B009 Herpesviral infection, unspecified: Secondary | ICD-10-CM | POA: Diagnosis not present

## 2016-09-24 DIAGNOSIS — B001 Herpesviral vesicular dermatitis: Secondary | ICD-10-CM | POA: Diagnosis not present

## 2016-09-24 MED ORDER — VALACYCLOVIR HCL 1 G PO TABS: 1000.0000 mg | ORAL_TABLET | Freq: Two times a day (BID) | ORAL | 1 refills | Status: DC

## 2016-09-24 NOTE — Progress Notes (Signed)
   Regina Oneill  MRN: [id number] DOB: 1946/09/06  PCP: Elvina SidleKurt Lauenstein, MD  Subjective:  Pt is a 70 year old female, history of hypertension and diabetes who presents to clinic for sore on lip x three days. She noticed a swollen lip with a rash that has not gone away.  Denies pain, numbness, tingling.  Tried camphor oil, has not helped. She notes she has had one of these many, many years ago.  Denies recent illness, fever, general malaise or life stressors.   Review of Systems  Constitutional: Negative for chills, fatigue and fever.  Respiratory: Negative.   Cardiovascular: Negative.   Skin: Positive for rash and wound.  Neurological: Negative for dizziness, weakness, numbness and headaches.    Patient Active Problem List   Diagnosis Date Noted  . Hypertension 07/27/2012  . DM (diabetes mellitus) (HCC) 03/26/2012  . Postmenopausal bleeding 02/09/2012    Current Outpatient Prescriptions on File Prior to Visit  Medication Sig Dispense Refill  . aspirin EC 81 MG tablet Take 81 mg by mouth daily.    . fluocinonide cream (LIDEX) 0.05 % Apply 1 application topically 2 (two) times daily as needed. For eczema 30 g 6  . glimepiride (AMARYL) 2 MG tablet Take 1 tablet (2 mg total) by mouth daily. 90 tablet 1  . losartan (COZAAR) 25 MG tablet Take 1 tablet (25 mg total) by mouth daily. 90 tablet 3  . metFORMIN (GLUCOPHAGE-XR) 750 MG 24 hr tablet Take 1 tablet (750 mg total) by mouth 2 (two) times daily. 180 tablet 1   No current facility-administered medications on file prior to visit.     Allergies  Allergen Reactions  . Lisinopril Cough    Objective:  BP 122/80 (BP Location: Right Arm, Patient Position: Sitting, Cuff Size: Large)   Pulse 77   Temp 99 F (37.2 C) (Oral)   Resp 17   Ht 5\' 3"  (1.6 m)   Wt 201 lb (91.2 kg)   SpO2 97%   BMI 35.61 kg/m   Physical Exam  Constitutional: She is oriented to person, place, and time and well-developed, well-nourished, and in no  distress. No distress.  Cardiovascular: Normal rate, regular rhythm and normal heart sounds.   Pulmonary/Chest: Effort normal. No respiratory distress.  Neurological: She is alert and oriented to person, place, and time. GCS score is 15.  Skin: Skin is warm and dry. Rash noted. Rash is vesicular.  Upper lip is swollen left of midline. Crusted vesicles noted on lip, does not cross [place] border. No drainage appreciated. Not TTP.  Psychiatric: Mood, memory, affect and judgment normal.  Vitals reviewed.   Assessment and Plan :  1. Herpes - valACYclovir (VALTREX) 1000 MG tablet; Take 1 tablet (1,000 mg total) by mouth 2 (two) times daily.  Dispense: 20 tablet; Refill: 1 - Patient education. Take medication as prescribed. Take medication as soon as outbreak occurs next time. RTC if symptoms do not improve/worsen.  Marco CollieWhitney Jade Burkard, PA-C  [facility] 09/24/2016 1:53 PM

## 2017-09-02 ENCOUNTER — Other Ambulatory Visit: Payer: Self-pay | Admitting: Physician Assistant

## 2017-09-02 DIAGNOSIS — I10 Essential (primary) hypertension: Secondary | ICD-10-CM

## 2017-09-02 NOTE — Telephone Encounter (Signed)
Hasn't been seen in a year.Marland Kitchen.Marland Kitchen.Marland Kitchen.Marland Kitchen.please advise.

## 2017-09-03 NOTE — Telephone Encounter (Signed)
I sent in a 30d supply to allow the patient to get into an appt.

## 2017-09-07 NOTE — Telephone Encounter (Signed)
MyChart message sent to pt about making an apt for more refills °

## 2017-10-14 ENCOUNTER — Other Ambulatory Visit: Payer: Self-pay | Admitting: Obstetrics & Gynecology

## 2017-10-15 NOTE — Patient Instructions (Addendum)
Your procedure is scheduled on:  Thursday, Nov. 1  [facility] at:  6 am  Pick up the phone at the desk and dial [phone].  Call this number if you have problems the morning of surgery: [phone].  Remember: Do NOT eat food or drink clear liquids (including water) after midnight Wednesday.  Take these medicines the morning of surgery with a SIP OF WATER: None  Do not take your diabetes medication on the day of surgery.  We will check your blood sugar and treat if needed.  Do not take your Wednesday night time dose of Metformin.    Stop herbal medications and supplements at this time.  Do NOT wear jewelry (body piercing), metal hair clips/bobby pins, make-up, or nail polish. Do NOT wear lotions, powders, or perfumes.  You may wear deoderant. Do NOT shave for 48 hours prior to surgery. Do NOT bring valuables to the hospital. Dentures may not be worn into surgery.  Have a responsible adult drive you home and stay with you for 24 hours after your procedure.  Home with sister Beulah cell [phone].

## 2017-10-16 ENCOUNTER — Encounter (HOSPITAL_COMMUNITY)
Admission: RE | Admit: 2017-10-16 | Discharge: 2017-10-16 | Disposition: A | Discharge status: Home / Self Care | Payer: Medicare Other | Source: Ambulatory Visit | Attending: Obstetrics & Gynecology | Admitting: Obstetrics & Gynecology

## 2017-10-16 ENCOUNTER — Other Ambulatory Visit: Payer: Self-pay

## 2017-10-16 ENCOUNTER — Encounter (HOSPITAL_COMMUNITY): Payer: Self-pay

## 2017-10-16 DIAGNOSIS — Z01818 Encounter for other preprocedural examination: Secondary | ICD-10-CM | POA: Insufficient documentation

## 2017-10-16 HISTORY — DX: Dermatitis, unspecified: L30.9

## 2017-10-16 HISTORY — DX: Dependence on other enabling machines and devices: Z99.89

## 2017-10-16 HISTORY — DX: Other seasonal allergic rhinitis: J30.2

## 2017-10-16 LAB — BASIC METABOLIC PANEL
ANION GAP: 7 (ref 5–15)
BUN: 13 mg/dL (ref 6–20)
CO2: 29 mmol/L (ref 22–32)
CREATININE: 0.93 mg/dL (ref 0.44–1.00)
Calcium: 9 mg/dL (ref 8.9–10.3)
Chloride: 100 mmol/L — ABNORMAL LOW (ref 101–111)
Glucose, Bld: 235 mg/dL — ABNORMAL HIGH (ref 65–99)
Potassium: 3.9 mmol/L (ref 3.5–5.1)
SODIUM: 136 mmol/L (ref 135–145)

## 2017-10-16 LAB — CBC
HCT: 37.8 % (ref 36.0–46.0)
HEMOGLOBIN: 12 g/dL (ref 12.0–15.0)
MCH: 27.6 pg (ref 26.0–34.0)
MCHC: 31.7 g/dL (ref 30.0–36.0)
MCV: 87.1 fL (ref 78.0–100.0)
PLATELETS: 410 10*3/uL — AB (ref 150–400)
RBC: 4.34 MIL/uL (ref 3.87–5.11)
RDW: 14.8 % (ref 11.5–15.5)
WBC: 9.6 10*3/uL (ref 4.0–10.5)

## 2017-10-16 NOTE — Pre-Procedure Instructions (Signed)
Spoke with Dr. Cristela BlueKyle Jackson.  Reviewed patient's medical history, medications and EKG.  Glucose at today's PAT appointment was 235.  Ok for surgery.  No orders given.

## 2017-10-28 NOTE — H&P (Signed)
Regina Oneill is an 71 y.o. female with recurrent postmenopausal uterine bleeding. Pelvic sono notes 2 endometrial polyps and is here for surgery.  She has prior h/o simple endometrial hyperplasia, h/o Megace use. H/o H/scopic polypectomy in 2013.  She has nl Pap hx. Not sexually active. No pelvic pain. Nl Mammograms  MedHx reviewed.  Obesity, DM, HTN. No current HRT use. Non smoker.   No LMP recorded. Patient is postmenopausal.    Past Medical History:  Diagnosis Date  . Arthritis    knee - no meds  . Diabetes mellitus    recently dx in 12/12 - type 2  . Eczema   . Hypertension    recently dx in 12/12  . Seasonal allergies   . Use of cane as ambulatory aid    occasional - does not use it all the time - straight cane    Past Surgical History:  Procedure Laterality Date  . CHOLECYSTECTOMY    . COLONOSCOPY     normal  . DILATION AND CURETTAGE OF UTERUS    . HYSTEROSCOPY W/D&C  02/09/2012   Procedure: DILATATION AND CURETTAGE /HYSTEROSCOPY;  Surgeon: Robley FriesVaishali R Quinlan Vollmer, MD;  Location: [facility];  Service: Gynecology;  Laterality: N/A;  true clear  . KNEE ARTHROSCOPY  06/2000   right  . MULTIPLE TOOTH EXTRACTIONS     all teeth extracted  . MULTIPLE TOOTH EXTRACTIONS     wears dentures  . SVD     x 3    Family History  Problem Relation Age of Onset  . Alzheimer's disease Mother   . Diabetes Sister   . Lupus Sister     Social History:  reports that she has never smoked. She has never used smokeless tobacco. She reports that she does not drink alcohol or use drugs.  Allergies:  Allergies  Allergen Reactions  . Lisinopril Cough    No prescriptions prior to admission.    ROS  neg BP 134/81   Pulse 66   Temp 98.1 F (36.7 C) (Oral)   Resp 18   SpO2 95%  Physical Exam Physical exam:  A&O x 3, no acute distress. Pleasant HEENT neg, no thyromegaly Lungs CTA bilat CV RRR, S1S2 normal Abdo soft, non tender, non acute Extr no edema/ tenderness Pelvic uterus, cx  normal    Assessment/Plan: 71 yo, postmenopausal female with recurrent uterine bleeding. Suspect endometrial polyps. Here for hysteroscopic polypectomy and D&C using Myosure.  Risks/complications of surgery reviewed incl infection, bleeding, damage to internal organs including bladder, bowels, ureters, blood vessels, other risks from anesthesia, VTE and delayed complications of any surgery, complications in future surgery reviewed.  Benigno Check R 10/28/2017, 7:17 PM

## 2017-10-29 ENCOUNTER — Ambulatory Visit (HOSPITAL_COMMUNITY): Payer: Medicare Other | Admitting: Anesthesiology

## 2017-10-29 ENCOUNTER — Encounter (HOSPITAL_COMMUNITY): Payer: Self-pay

## 2017-10-29 ENCOUNTER — Encounter (HOSPITAL_COMMUNITY)
Admission: AD | Disposition: A | Discharge status: Home / Self Care | Payer: Self-pay | Source: Ambulatory Visit | Attending: Obstetrics & Gynecology

## 2017-10-29 ENCOUNTER — Ambulatory Visit (HOSPITAL_COMMUNITY)
Admission: AD | Admit: 2017-10-29 | Discharge: 2017-10-29 | Disposition: A | Discharge status: Home / Self Care | Payer: Medicare Other | Source: Ambulatory Visit | Attending: Obstetrics & Gynecology | Admitting: Obstetrics & Gynecology

## 2017-10-29 DIAGNOSIS — Z79899 Other long term (current) drug therapy: Secondary | ICD-10-CM | POA: Insufficient documentation

## 2017-10-29 DIAGNOSIS — Z6836 Body mass index (BMI) 36.0-36.9, adult: Secondary | ICD-10-CM | POA: Insufficient documentation

## 2017-10-29 DIAGNOSIS — E669 Obesity, unspecified: Secondary | ICD-10-CM | POA: Insufficient documentation

## 2017-10-29 DIAGNOSIS — N950 Postmenopausal bleeding: Secondary | ICD-10-CM | POA: Diagnosis not present

## 2017-10-29 DIAGNOSIS — Z7982 Long term (current) use of aspirin: Secondary | ICD-10-CM | POA: Diagnosis not present

## 2017-10-29 DIAGNOSIS — E119 Type 2 diabetes mellitus without complications: Secondary | ICD-10-CM | POA: Insufficient documentation

## 2017-10-29 DIAGNOSIS — N840 Polyp of corpus uteri: Secondary | ICD-10-CM | POA: Diagnosis not present

## 2017-10-29 DIAGNOSIS — I10 Essential (primary) hypertension: Secondary | ICD-10-CM | POA: Diagnosis not present

## 2017-10-29 DIAGNOSIS — Z888 Allergy status to other drugs, medicaments and biological substances status: Secondary | ICD-10-CM | POA: Insufficient documentation

## 2017-10-29 DIAGNOSIS — M1990 Unspecified osteoarthritis, unspecified site: Secondary | ICD-10-CM | POA: Diagnosis not present

## 2017-10-29 HISTORY — PX: DILATATION & CURETTAGE/HYSTEROSCOPY WITH MYOSURE: SHX6511

## 2017-10-29 LAB — GLUCOSE, CAPILLARY: Glucose-Capillary: 107 mg/dL — ABNORMAL HIGH (ref 65–99)

## 2017-10-29 SURGERY — DILATATION & CURETTAGE/HYSTEROSCOPY WITH MYOSURE
Anesthesia: General | Site: Vagina

## 2017-10-29 MED ORDER — LIDOCAINE HCL (CARDIAC) 20 MG/ML IV SOLN
INTRAVENOUS | Status: AC
  Filled 2017-10-29: qty 5

## 2017-10-29 MED ORDER — LIDOCAINE HCL 1 % IJ SOLN
INTRAMUSCULAR | Status: DC | PRN
  Administered 2017-10-29: 20 mL

## 2017-10-29 MED ORDER — EPHEDRINE 5 MG/ML INJ
INTRAVENOUS | Status: AC
  Filled 2017-10-29: qty 10

## 2017-10-29 MED ORDER — LIDOCAINE HCL (CARDIAC) 20 MG/ML IV SOLN
INTRAVENOUS | Status: DC | PRN
  Administered 2017-10-29: 60 mg via INTRAVENOUS

## 2017-10-29 MED ORDER — MIDAZOLAM HCL 2 MG/2ML IJ SOLN
INTRAMUSCULAR | Status: AC
  Filled 2017-10-29: qty 2

## 2017-10-29 MED ORDER — KETOROLAC TROMETHAMINE 30 MG/ML IJ SOLN
INTRAMUSCULAR | Status: AC
  Filled 2017-10-29: qty 1

## 2017-10-29 MED ORDER — LACTATED RINGERS IV SOLN
INTRAVENOUS | Status: DC
  Administered 2017-10-29: 07:00:00 via INTRAVENOUS

## 2017-10-29 MED ORDER — PROPOFOL 10 MG/ML IV BOLUS
INTRAVENOUS | Status: AC
  Filled 2017-10-29: qty 20

## 2017-10-29 MED ORDER — FENTANYL CITRATE (PF) 100 MCG/2ML IJ SOLN
INTRAMUSCULAR | Status: DC | PRN
  Administered 2017-10-29 (×2): 25 ug via INTRAVENOUS

## 2017-10-29 MED ORDER — IBUPROFEN 200 MG PO TABS: 600.0000 mg | ORAL_TABLET | Freq: Three times a day (TID) | ORAL | 0 refills | Status: AC | PRN

## 2017-10-29 MED ORDER — DEXAMETHASONE SODIUM PHOSPHATE 10 MG/ML IJ SOLN
INTRAMUSCULAR | Status: DC | PRN
  Administered 2017-10-29: 4 mg via INTRAVENOUS

## 2017-10-29 MED ORDER — CEFAZOLIN SODIUM-DEXTROSE 2-3 GM-%(50ML) IV SOLR
INTRAVENOUS | Status: AC
  Filled 2017-10-29: qty 50

## 2017-10-29 MED ORDER — FENTANYL CITRATE (PF) 100 MCG/2ML IJ SOLN
INTRAMUSCULAR | Status: AC
  Filled 2017-10-29: qty 2

## 2017-10-29 MED ORDER — EPHEDRINE SULFATE 50 MG/ML IJ SOLN
INTRAMUSCULAR | Status: DC | PRN
  Administered 2017-10-29: 10 mg via INTRAVENOUS

## 2017-10-29 MED ORDER — FENTANYL CITRATE (PF) 100 MCG/2ML IJ SOLN: 25.0000 ug | INTRAMUSCULAR | Status: DC | PRN

## 2017-10-29 MED ORDER — LIDOCAINE HCL 1 % IJ SOLN
INTRAMUSCULAR | Status: AC
  Filled 2017-10-29: qty 1

## 2017-10-29 MED ORDER — SODIUM CHLORIDE 0.9 % IR SOLN
Status: DC | PRN
  Administered 2017-10-29: 3000 mL

## 2017-10-29 MED ORDER — DEXAMETHASONE SODIUM PHOSPHATE 4 MG/ML IJ SOLN
INTRAMUSCULAR | Status: AC
  Filled 2017-10-29: qty 1

## 2017-10-29 MED ORDER — ONDANSETRON HCL 4 MG/2ML IJ SOLN
INTRAMUSCULAR | Status: AC
  Filled 2017-10-29: qty 2

## 2017-10-29 MED ORDER — PROPOFOL 10 MG/ML IV BOLUS
INTRAVENOUS | Status: DC | PRN
  Administered 2017-10-29: 150 mg via INTRAVENOUS

## 2017-10-29 MED ORDER — ONDANSETRON HCL 4 MG/2ML IJ SOLN
INTRAMUSCULAR | Status: DC | PRN
Start: 2017-10-29 — End: 2017-10-29
  Administered 2017-10-29: 4 mg via INTRAVENOUS

## 2017-10-29 MED ORDER — CEFAZOLIN SODIUM-DEXTROSE 2-4 GM/100ML-% IV SOLN
2.0000 g | INTRAVENOUS | Status: AC
  Administered 2017-10-29: 2 g via INTRAVENOUS

## 2017-10-29 SURGICAL SUPPLY — 16 items
CANISTER SUCT 3000ML PPV (MISCELLANEOUS) ×3 IMPLANT
CATH ROBINSON RED A/P 16FR (CATHETERS) ×3 IMPLANT
CONTAINER PREFILL 10% NBF 60ML (FORM) ×6 IMPLANT
DEVICE MYOSURE LITE (MISCELLANEOUS) ×2 IMPLANT
DEVICE MYOSURE REACH (MISCELLANEOUS) IMPLANT
FILTER ARTHROSCOPY CONVERTOR (FILTER) ×3 IMPLANT
GLOVE BIO SURGEON STRL SZ7 (GLOVE) ×3 IMPLANT
GLOVE BIOGEL PI IND STRL 7.0 (GLOVE) ×2 IMPLANT
GLOVE BIOGEL PI INDICATOR 7.0 (GLOVE) ×4
GOWN STRL REUS W/TWL LRG LVL3 (GOWN DISPOSABLE) ×6 IMPLANT
PACK VAGINAL MINOR WOMEN LF (CUSTOM PROCEDURE TRAY) ×3 IMPLANT
PAD OB MATERNITY 4.3X12.25 (PERSONAL CARE ITEMS) ×3 IMPLANT
SEAL ROD LENS SCOPE MYOSURE (ABLATOR) ×3 IMPLANT
TOWEL OR 17X24 6PK STRL BLUE (TOWEL DISPOSABLE) ×6 IMPLANT
TUBING AQUILEX INFLOW (TUBING) ×3 IMPLANT
TUBING AQUILEX OUTFLOW (TUBING) ×3 IMPLANT

## 2017-10-29 NOTE — Discharge Instructions (Signed)

## 2017-10-29 NOTE — Anesthesia Procedure Notes (Signed)
Procedure Name: LMA Insertion Date/Time: 10/29/2017 7:38 AM Performed by: Elgie CongoMALINOVA, Candyce Gambino H Pre-anesthesia Checklist: Patient identified, Emergency Drugs available, Suction available and Patient being monitored Patient Re-evaluated:Patient Re-evaluated prior to induction Oxygen Delivery Method: Circle system utilized Preoxygenation: Pre-oxygenation with 100% oxygen Induction Type: IV induction Ventilation: Mask ventilation without difficulty LMA: LMA inserted LMA Size: 4.0 Number of attempts: 1 Placement Confirmation: positive ETCO2 and breath sounds checked- equal and bilateral Tube secured with: Tape Dental Injury: Teeth and Oropharynx as per pre-operative assessment

## 2017-10-29 NOTE — Transfer of Care (Signed)
Immediate Anesthesia Transfer of Care Note  Patient: Regina Oneill  Procedure(s) Performed: DILATATION & CURETTAGE/HYSTEROSCOPY WITH MYOSURE POLYPECTOMY (N/A Vagina )  Patient Location: PACU  Anesthesia Type:General  Level of Consciousness: awake, alert  and oriented  Airway & Oxygen Therapy: Patient Spontanous Breathing and Patient connected to nasal cannula oxygen  Post-op Assessment: Report given to RN, Post -op Vital signs reviewed and stable and Patient moving all extremities  Post vital signs: Reviewed and stable  Last Vitals:  Vitals:   10/29/17 0604 10/29/17 0820  BP: 134/81 (!) 142/58  Pulse: 66 83  Resp: 18 14  Temp: 36.7 C 36.5 C  SpO2: 95% 97%    Last Pain:  Vitals:   10/29/17 0604  TempSrc: Oral         Complications: No apparent anesthesia complications

## 2017-10-29 NOTE — Anesthesia Preprocedure Evaluation (Addendum)
Anesthesia Evaluation  Patient identified by MRN, date of birth, ID band  Reviewed: Allergy & Precautions, NPO status , Patient's Chart, lab work & pertinent test results  Airway Mallampati: II  TM Distance: >3 FB     Dental   Pulmonary neg pulmonary ROS,    breath sounds clear to auscultation       Cardiovascular hypertension,  Rhythm:Regular Rate:Normal     Neuro/Psych negative neurological ROS     GI/Hepatic negative GI ROS, Neg liver ROS,   Endo/Other  diabetes  Renal/GU negative Renal ROS     Musculoskeletal  (+) Arthritis ,   Abdominal   Peds  Hematology   Anesthesia Other Findings   Reproductive/Obstetrics                            Anesthesia Physical Anesthesia Plan  ASA: III  Anesthesia Plan: General   Post-op Pain Management:    Induction: Intravenous  PONV Risk Score and Plan: 3 and Ondansetron, Dexamethasone, Midazolam, Propofol infusion and Treatment may vary due to age or medical condition  Airway Management Planned: LMA and Oral ETT  Additional Equipment:   Intra-op Plan:   Post-operative Plan: Extubation in OR  Informed Consent: I have reviewed the patients History and Physical, chart, labs and discussed the procedure including the risks, benefits and alternatives for the proposed anesthesia with the patient or authorized representative who has indicated his/her understanding and acceptance.   Dental advisory given  Plan Discussed with: CRNA and Anesthesiologist  Anesthesia Plan Comments:         Anesthesia Quick Evaluation

## 2017-10-29 NOTE — Anesthesia Postprocedure Evaluation (Signed)
Anesthesia Post Note  Patient: Regina Oneill  Procedure(s) Performed: DILATATION & CURETTAGE/HYSTEROSCOPY WITH MYOSURE POLYPECTOMY (N/A Vagina )     Patient location during evaluation: PACU Anesthesia Type: General Level of consciousness: awake Pain management: pain level controlled Respiratory status: spontaneous breathing Cardiovascular status: stable Anesthetic complications: no    Last Vitals:  Vitals:   10/29/17 0900 10/29/17 0940  BP: 136/63 (!) 142/74  Pulse: 66 72  Resp: 14 18  Temp:    SpO2: 96% 99%    Last Pain:  Vitals:   10/29/17 0604  TempSrc: Oral   Pain Goal:                 [name]

## 2017-10-29 NOTE — Op Note (Signed)
Preoperative diagnosis: Postmenopausal bleeding, endometrial polyp  Postop diagnosis: as above.  Procedure: Hysteroscopic polypectomy and endometrial curettage with Myosure lite Anesthesia General Surgeon: Shea EvansVaishali Shawnmichael Parenteau, MD  IV fluids : 700 cc LR Estimated blood loss  Fluid deficit: Saline 500 cc Urine output: straight catheter preop  25 cc Complications none  Condition stable  Disposition PACU  Specimen: Endometrial polyp with endometrial curettings   Procedure  Indication: Postmenopausal bleeding, pelvic ultrasound noted endometrial polyp. Remote history of endometrial hyperplasia. Patient was counseled on risks/ complications including infection, bleeding, damage to internal organs, she understood and agrees, gave informed written consent.  Patient was brought to the operating room with IV running. Time out was carried out. She received preop 2 gm Ancef. She underwent general anesthesia without complications. She was given dorsolithotomy position. Parts were prepped and draped in standard fashion. Bladder was catheterized once. Bimanual exam revealed uterus to be anteverted and normal size. Speculum was placed and cervix was grasped with single-tooth tenaculum. Cervical block with 20 cc 1%  Xylocaine given. The uterus was sounded to 8 cm. Cervical os was dilated to 21 JamaicaFrench dilator. Myosure hysteroscope was introduced in the uterine cavity under vision, using saline for irrigation. Findings: Smooth endometrial cavity with normal tubal ostii. Single endometrial polyp noted on the left lateral mid uterus level.  Hysteroscopic polypectomy was performed with Myosure and curettage of the entire cavity done under vision. Specimen to path. Hysteroscope was removed. Fluid deficit 500 cc.  All counts are correct x2. No complications. Patient was made supine dorsal anesthesia and brought to the recovery room in stable condition.  Patient will be discharged home today. Follow up in 2 weeks in office.  Warning signs of infection and excessive bleeding reviewed.   V.Aviella Disbrow, MD.

## 2017-10-30 ENCOUNTER — Encounter (HOSPITAL_COMMUNITY): Payer: Self-pay | Admitting: Obstetrics & Gynecology

## 2017-10-30 LAB — GLUCOSE, CAPILLARY: Glucose-Capillary: 138 mg/dL — ABNORMAL HIGH (ref 65–99)

## 2018-03-09 ENCOUNTER — Other Ambulatory Visit: Payer: Self-pay | Admitting: Family Medicine

## 2018-03-09 DIAGNOSIS — E2839 Other primary ovarian failure: Secondary | ICD-10-CM

## 2022-10-24 ENCOUNTER — Emergency Department (HOSPITAL_COMMUNITY): Payer: Medicare PPO

## 2022-10-24 ENCOUNTER — Emergency Department (HOSPITAL_COMMUNITY)
Admission: EM | Admit: 2022-10-24 | Discharge: 2022-10-25 | Discharge status: Against Medical Advice | Payer: Medicare PPO | Attending: Emergency Medicine | Admitting: Emergency Medicine

## 2022-10-24 ENCOUNTER — Other Ambulatory Visit: Payer: Self-pay

## 2022-10-24 ENCOUNTER — Encounter (HOSPITAL_COMMUNITY): Payer: Self-pay | Admitting: Emergency Medicine

## 2022-10-24 DIAGNOSIS — M25569 Pain in unspecified knee: Secondary | ICD-10-CM | POA: Insufficient documentation

## 2022-10-24 DIAGNOSIS — M791 Myalgia, unspecified site: Secondary | ICD-10-CM | POA: Insufficient documentation

## 2022-10-24 DIAGNOSIS — Z5321 Procedure and treatment not carried out due to patient leaving prior to being seen by health care provider: Secondary | ICD-10-CM | POA: Diagnosis not present

## 2022-10-24 DIAGNOSIS — G8929 Other chronic pain: Secondary | ICD-10-CM | POA: Diagnosis not present

## 2022-10-24 LAB — COMPREHENSIVE METABOLIC PANEL
ALT: 16 U/L (ref 0–44)
AST: 19 U/L (ref 15–41)
Albumin: 3.7 g/dL (ref 3.5–5.0)
Alkaline Phosphatase: 72 U/L (ref 38–126)
Anion gap: 10 (ref 5–15)
BUN: 18 mg/dL (ref 8–23)
CO2: 22 mmol/L (ref 22–32)
Calcium: 9.4 mg/dL (ref 8.9–10.3)
Chloride: 105 mmol/L (ref 98–111)
Creatinine, Ser: 1.18 mg/dL — ABNORMAL HIGH (ref 0.44–1.00)
GFR, Estimated: 48 mL/min — ABNORMAL LOW (ref >60)
Glucose, Bld: 126 mg/dL — ABNORMAL HIGH (ref 70–99)
Potassium: 4.4 mmol/L (ref 3.5–5.1)
Sodium: 137 mmol/L (ref 135–145)
Total Bilirubin: 0.3 mg/dL (ref 0.3–1.2)
Total Protein: 7.5 g/dL (ref 6.5–8.1)

## 2022-10-24 LAB — CBC
HCT: 38.3 % (ref 36.0–46.0)
Hemoglobin: 11.9 g/dL — ABNORMAL LOW (ref 12.0–15.0)
MCH: 28.5 pg (ref 26.0–34.0)
MCHC: 31.1 g/dL (ref 30.0–36.0)
MCV: 91.6 fL (ref 80.0–100.0)
Platelets: 352 10*3/uL (ref 150–400)
RBC: 4.18 MIL/uL (ref 3.87–5.11)
RDW: 14 % (ref 11.5–15.5)
WBC: 10 10*3/uL (ref 4.0–10.5)
nRBC: 0 % (ref 0.0–0.2)

## 2022-10-24 LAB — TROPONIN I (HIGH SENSITIVITY)
Troponin I (High Sensitivity): 3 ng/L (ref <18)
Troponin I (High Sensitivity): 5 ng/L (ref <18)

## 2022-10-24 NOTE — ED Provider Triage Note (Signed)
Emergency Medicine Provider Triage Evaluation Note  Regina Oneill , a 76 y.o. female  was evaluated in triage.  Pt complains of R side of her body pain (head, chest abdomen). She states now she has no pain other than some knee pain (chronic). She states the pain lasted 30 minutes. States no sx currently. No sob or nausea during the episode.  Review of Systems  Positive: CP/HA/Abd pain now all resolved Negative: Fatigue, NV  Physical Exam  BP (!) 142/69 (BP Location: Right Arm)   Pulse 64   Temp 98 F (36.7 C) (Oral)   Resp 16   SpO2 96%  Gen:   Awake, no distress   Resp:  Normal effort  MSK:   Moves extremities without difficulty  Other:  Abd soft NTTP  Medical Decision Making  Medically screening exam initiated at 3:50 PM.  Appropriate orders placed.  Regina Oneill was informed that the remainder of the evaluation will be completed by another provider, this initial triage assessment does not replace that evaluation, and the importance of remaining in the ED until their evaluation is complete.  Labs, cxr   Pati Gallo [place], [place] 10/24/22 [phone]

## 2022-10-24 NOTE — ED Triage Notes (Signed)
Patient here with complaint of right sided chest, head, and abdominal pain that started at approximately 1350. Patient denies dizziness, denies shortness of breath, denies nausea. Patient is alert, oriented, and in no apparent distress at this time. Patient denies pain right now, states her sister had a heart attack approximately two weeks ago.

## 2022-10-25 NOTE — ED Notes (Signed)
Patient left on own accord °
